# Patient Record
Sex: Male | Born: 1960 | Race: Black or African American | Hispanic: No | Marital: Married | Smoking: Current some day smoker
Health system: Southern US, Community
[De-identification: ages and names within clinical notes are randomized; demographics above are authoritative.]

## PROBLEM LIST (undated history)

## (undated) DIAGNOSIS — R0683 Snoring: Secondary | ICD-10-CM

## (undated) DIAGNOSIS — H9190 Unspecified hearing loss, unspecified ear: Secondary | ICD-10-CM

## (undated) DIAGNOSIS — K625 Hemorrhage of anus and rectum: Secondary | ICD-10-CM

## (undated) DIAGNOSIS — Z974 Presence of external hearing-aid: Secondary | ICD-10-CM

## (undated) DIAGNOSIS — M549 Dorsalgia, unspecified: Secondary | ICD-10-CM

## (undated) DIAGNOSIS — Z789 Other specified health status: Secondary | ICD-10-CM

## (undated) HISTORY — PX: COLONOSCOPY: SHX174

## (undated) HISTORY — DX: Unspecified hearing loss, unspecified ear: H91.90

## (undated) HISTORY — PX: WISDOM TOOTH EXTRACTION: SHX21

---

## 2013-01-05 ENCOUNTER — Ambulatory Visit (INDEPENDENT_AMBULATORY_CARE_PROVIDER_SITE_OTHER): Payer: 59 | Admitting: Family Medicine

## 2013-01-05 ENCOUNTER — Ambulatory Visit: Payer: 59

## 2013-01-05 VITALS — BP 136/84 | HR 74 | Temp 98.7°F | Resp 17 | Ht 69.0 in | Wt 211.0 lb

## 2013-01-05 DIAGNOSIS — M79646 Pain in unspecified finger(s): Secondary | ICD-10-CM

## 2013-01-05 DIAGNOSIS — S61012A Laceration without foreign body of left thumb without damage to nail, initial encounter: Secondary | ICD-10-CM

## 2013-01-05 DIAGNOSIS — S61209A Unspecified open wound of unspecified finger without damage to nail, initial encounter: Secondary | ICD-10-CM

## 2013-01-05 MED ORDER — TRAMADOL HCL 50 MG PO TABS: 50.0000 mg | ORAL_TABLET | Freq: Three times a day (TID) | ORAL | Status: DC | PRN

## 2013-01-05 NOTE — Progress Notes (Signed)
Patient ID: ASHOK SAWAYA MRN: [id number], DOB: Jun 24, 1961, 52 y.o. Date of Encounter: 01/05/2013, 12:04 PM   PROCEDURE NOTE: Verbal consent obtained. Sterile technique employed. Numbing: Anesthesia obtained with 2% lidocaine plain.   Cleansed with soap and water. Irrigated.  Wound explored, no deep structures involved, no foreign bodies.   Wound repaired with # 3 SI and #2 HM sutures Hemostasis obtained. Wound cleansed and dressed.  Wound care instructions including precautions covered with patient. Handout given.  Anticipate suture removal in 10 days  Rhoderick Moody, PA-C 01/05/2013 12:04 PM

## 2013-01-05 NOTE — Progress Notes (Signed)
  [facility]:  Office Visit  Chief Complaint:  Chief Complaint  Patient presents with  . Hand Injury    cut thumb     HPI: Roy Bryant is a right handed 52 y.o. male who complains of   Left thumb laceration last night at 12:30 am , opening/cutting boxes with his personal pocket knife and slipped. + pain and bleeding. Denies numbness, tingling. Used gauze and compression to stop bleeding. UTD on tetanus. He denies having diabetes. He works for the city of [employer] and uses his hand. He is here because the bleeding is still continuing even after compression. He does not take any blood thinners.   Past Medical History  Diagnosis Date  . Hearing loss    History reviewed. No pertinent past surgical history. History   Social History  . Marital Status: Single    Spouse Name: N/A    Number of Children: N/A  . Years of Education: N/A   Social History Main Topics  . Smoking status: Current Some Day Smoker    Types: Cigarettes  . Smokeless tobacco: None  . Alcohol Use: 0.6 oz/week    1 Cans of beer per week  . Drug Use: No  . Sexually Active: Yes    Birth Control/ Protection: Abstinence   Other Topics Concern  . None   Social History Narrative  . None   Family History  Problem Relation Age of Onset  . Diabetes Daughter    No Known Allergies Prior to Admission medications   Not on File     ROS: The patient denies fevers, chills, night sweats, unintentional weight loss, chest pain, palpitations, wheezing, dyspnea on exertion, nausea, vomiting, abdominal pain, dysuria, hematuria, melena, numbness, weakness, or tingling.   All other systems have been reviewed and were otherwise negative with the exception of those mentioned in the HPI and as above.    PHYSICAL EXAM: Filed Vitals:   01/05/13 1032  BP: 136/84  Pulse: 74  Temp: 98.7 F (37.1 C)  Resp: 17   Filed Vitals:   01/05/13 1032  Height: 5\' 9"  (1.753 m)  Weight: 211 lb (95.709 kg)    Body mass index is 31.16 kg/(m^2).  General: Alert, no acute distress HEENT:  Normocephalic, atraumatic, oropharynx patent.  Cardiovascular:  Regular rate and rhythm, no rubs murmurs or gallops.  No Carotid bruits, radial pulse intact. No pedal edema.  Respiratory: Clear to auscultation bilaterally.  No wheezes, rales, or rhonchi.  No cyanosis, no use of accessory musculature GI: No organomegaly, abdomen is soft and non-tender, positive bowel sounds.  No masses. Skin: No rashes. Neurologic: Facial musculature symmetric. Psychiatric: Patient is appropriate throughout our interaction. Lymphatic: No cervical lymphadenopathy Musculoskeletal: Gait intact. Left thumb-+ 3/4 inch horizontal lac proximal to IP jt No tendon involvement Full ROM, 5/5 strength , sensation and radial pulse intact + tenderness   LABS: No results found for this or any previous visit.   EKG/XRAY:   Primary read interpreted by Dr. Conley Rolls at [facility]. No fractures/dislocation/foreign bodies.   ASSESSMENT/PLAN: Encounter Diagnosis  Name Primary?  . Laceration of thumb, left Yes   Stitches Clean wound,  no need for abx Rx Tramadol for pain control  Monitor for worsening sxs F/u as directed, wound care as directed    ,  PHUONG, DO 01/05/2013 11:58 AM

## 2013-01-14 ENCOUNTER — Ambulatory Visit (INDEPENDENT_AMBULATORY_CARE_PROVIDER_SITE_OTHER): Payer: 59 | Admitting: Physician Assistant

## 2013-01-14 VITALS — BP 141/83 | HR 76 | Temp 98.0°F | Resp 16 | Ht 69.0 in | Wt 216.0 lb

## 2013-01-14 DIAGNOSIS — L02519 Cutaneous abscess of unspecified hand: Secondary | ICD-10-CM

## 2013-01-14 DIAGNOSIS — T148XXA Other injury of unspecified body region, initial encounter: Secondary | ICD-10-CM

## 2013-01-14 DIAGNOSIS — L089 Local infection of the skin and subcutaneous tissue, unspecified: Secondary | ICD-10-CM

## 2013-01-14 DIAGNOSIS — Z4802 Encounter for removal of sutures: Secondary | ICD-10-CM

## 2013-01-14 MED ORDER — SULFAMETHOXAZOLE-TRIMETHOPRIM 800-160 MG PO TABS: 1.0000 | ORAL_TABLET | Freq: Two times a day (BID) | ORAL | Status: DC

## 2013-01-14 NOTE — Progress Notes (Signed)
  Subjective:    Patient ID: Roy Bryant, male    DOB: [birth year], 52 y.o.   MRN: [id number]  HPI   Roy Bryant is 52 yr old male here for removal of sutures placed here 10 days ago.  States he is doing well, but still has some tenderness over the wound.  Still having some difficulty fully flexing the thumb, has concerns that there may have been tendon involvement.    Review of Systems  Constitutional: Negative.   HENT: Negative.   Respiratory: Negative.   Cardiovascular: Negative.   Gastrointestinal: Negative.   Musculoskeletal: Negative.   Skin: Positive for wound (thumb, left).  Neurological: Negative.        Objective:   Physical Exam  Vitals reviewed. Constitutional: He is oriented to person, place, and time. He appears well-developed and well-nourished. No distress.  HENT:  Head: Normocephalic and atraumatic.  Eyes: Conjunctivae normal are normal. No scleral icterus.  Pulmonary/Chest: Effort normal.  Musculoskeletal:       Left hand: He exhibits normal range of motion and normal capillary refill. normal sensation noted. Normal strength noted.       Hands: Neurological: He is alert and oriented to person, place, and time.  Skin: Skin is warm and dry.       Dorsal aspect of left thumb with healing laceration; wound is closed, edges well approximated, some scabbing; TTP over the proximal wound edge; on removal of suture some purulent drainage; no erythema, induration, or warmth  Psychiatric: He has a normal mood and affect. His behavior is normal.     Filed Vitals:   01/14/13 0841  BP: 141/83  Pulse: 76  Temp: 98 F (36.7 C)  Resp: 16        Assessment & Plan:   1. Wound infection  sulfamethoxazole-trimethoprim (BACTRIM DS,SEPTRA DS) 800-160 MG per tablet, Wound culture  2. Visit for suture removal      Roy Bryant is a very pleasant 52 yr old male here for suture removal.  Healing laceration of the dorsal aspect of the left thumb.  Sutures removed.  With  removal of two of the sutures, some purulence drained from the proximal wound edge.  Culture collected.  Will start TMP/SMX BID x 7 days. Will adjust if necessary based on culture data.  Discussed with pt that the flexor tendon of the thumb is located on the palmar side of the thumb, opposite of where his wound is located.  Suspect that with flexion he is feeling tension across the wound from where the sutures were placed as the laceration is directly over DIP joint.  Encouraged him to work on gentle ROM.  He will let us know if worsening or not improving.

## 2013-01-14 NOTE — Patient Instructions (Addendum)
Begin taking the antibiotic as directed.  Take with food to reduce stomach upset.  Let us know if there is continued pain, redness, swelling, drainage, or if you have fever.   Wound Infection A wound infection happens when a type of germ (bacteria) starts growing in the wound. In some cases, this can cause the wound to break open. If cared for properly, the infected wound will heal from the inside to the outside. Wound infections need treatment. CAUSES An infection is caused by bacteria growing in the wound.  SYMPTOMS   Increase in redness, swelling, or pain at the wound site.  Increase in drainage at the wound site.  Wound or bandage (dressing) starts to smell bad.  Fever.  Feeling tired or fatigued.  Pus draining from the wound. TREATMENT  You caregiver will prescribe antibiotic medicine. The wound infection should improve within 24 to 48 hours. Any redness around the wound should stop spreading and the wound should be less painful.  HOME CARE INSTRUCTIONS   Only take over-the-counter or prescription medicines for pain, discomfort, or fever as directed by your caregiver.  Take your antibiotics as directed. Finish them even if you start to feel better.  Gently wash the area with mild soap and water 2 times a day, or as directed. Rinse off the soap. Pat the area dry with a clean towel. Do not rub the wound. This may cause bleeding.  Follow your caregiver's instructions for how often you need to change the dressing.  Apply ointment and a dressing to the wound as directed.  If the dressing sticks, moisten it with soapy water and gently remove it.  Change the bandage right away if it becomes wet, dirty, or develops a bad smell.  Take showers. Do not take tub baths, swim, or do anything that may soak the wound until it is healed.  Avoid exercises that make you sweat heavily.  Use anti-itch medicine as directed by your caregiver. The wound may itch when it is healing. Do not pick  or scratch at the wound.  Follow up with your caregiver to get your wound rechecked as directed. SEEK MEDICAL CARE IF:  You have an increase in swelling, pain, or redness around the wound.  You have an increase in the amount of pus coming from the wound.  There is a bad smell coming from the wound.  More of the wound breaks open.  You have a fever. MAKE SURE YOU:   Understand these instructions.  Will watch your condition.  Will get help right away if you are not doing well or get worse. Document Released: 08/25/2003 Document Revised: 02/18/2012 Document Reviewed: 04/01/2011 [facility] Patient Information 2013 [employer], [place].

## 2013-01-16 LAB — WOUND CULTURE
Gram Stain: NONE SEEN
Gram Stain: NONE SEEN

## 2013-01-30 ENCOUNTER — Encounter (HOSPITAL_BASED_OUTPATIENT_CLINIC_OR_DEPARTMENT_OTHER): Payer: Self-pay | Admitting: *Deleted

## 2013-01-30 ENCOUNTER — Other Ambulatory Visit: Payer: Self-pay | Admitting: Orthopedic Surgery

## 2013-01-30 NOTE — Progress Notes (Signed)
Denies any ht or resp problems

## 2013-02-02 ENCOUNTER — Encounter (HOSPITAL_BASED_OUTPATIENT_CLINIC_OR_DEPARTMENT_OTHER): Payer: Self-pay | Admitting: Anesthesiology

## 2013-02-02 ENCOUNTER — Ambulatory Visit (HOSPITAL_BASED_OUTPATIENT_CLINIC_OR_DEPARTMENT_OTHER): Payer: 59 | Admitting: Anesthesiology

## 2013-02-02 ENCOUNTER — Ambulatory Visit (HOSPITAL_BASED_OUTPATIENT_CLINIC_OR_DEPARTMENT_OTHER)
Admission: RE | Admit: 2013-02-02 | Discharge: 2013-02-02 | Disposition: A | Discharge status: Home / Self Care | Payer: 59 | Source: Ambulatory Visit | Attending: Orthopedic Surgery | Admitting: Orthopedic Surgery

## 2013-02-02 ENCOUNTER — Encounter (HOSPITAL_BASED_OUTPATIENT_CLINIC_OR_DEPARTMENT_OTHER)
Admission: RE | Disposition: A | Discharge status: Home / Self Care | Payer: Self-pay | Source: Ambulatory Visit | Attending: Orthopedic Surgery

## 2013-02-02 ENCOUNTER — Encounter (HOSPITAL_BASED_OUTPATIENT_CLINIC_OR_DEPARTMENT_OTHER): Payer: Self-pay | Admitting: *Deleted

## 2013-02-02 DIAGNOSIS — F17200 Nicotine dependence, unspecified, uncomplicated: Secondary | ICD-10-CM | POA: Insufficient documentation

## 2013-02-02 DIAGNOSIS — H9190 Unspecified hearing loss, unspecified ear: Secondary | ICD-10-CM | POA: Insufficient documentation

## 2013-02-02 DIAGNOSIS — S61209A Unspecified open wound of unspecified finger without damage to nail, initial encounter: Secondary | ICD-10-CM | POA: Insufficient documentation

## 2013-02-02 DIAGNOSIS — W269XXA Contact with unspecified sharp object(s), initial encounter: Secondary | ICD-10-CM | POA: Insufficient documentation

## 2013-02-02 HISTORY — DX: Other specified health status: Z78.9

## 2013-02-02 HISTORY — DX: Snoring: R06.83

## 2013-02-02 HISTORY — PX: TENDON REPAIR: SHX5111

## 2013-02-02 HISTORY — DX: Presence of external hearing-aid: Z97.4

## 2013-02-02 LAB — POCT HEMOGLOBIN-HEMACUE: Hemoglobin: 14 g/dL (ref 13.0–17.0)

## 2013-02-02 SURGERY — TENDON REPAIR
Anesthesia: General | Site: Thumb | Laterality: Left | Wound class: Contaminated

## 2013-02-02 MED ORDER — FENTANYL CITRATE 0.05 MG/ML IJ SOLN: 50.0000 ug | INTRAMUSCULAR | Status: DC | PRN

## 2013-02-02 MED ORDER — MIDAZOLAM HCL 2 MG/2ML IJ SOLN: 1.0000 mg | INTRAMUSCULAR | Status: DC | PRN

## 2013-02-02 MED ORDER — HYDROCODONE-ACETAMINOPHEN 5-325 MG PO TABS: ORAL_TABLET | ORAL | Status: DC

## 2013-02-02 MED ORDER — LACTATED RINGERS IV SOLN
INTRAVENOUS | Status: DC
  Administered 2013-02-02: 10 mL/h via INTRAVENOUS
  Administered 2013-02-02 (×2): via INTRAVENOUS

## 2013-02-02 MED ORDER — DEXAMETHASONE SODIUM PHOSPHATE 4 MG/ML IJ SOLN
INTRAMUSCULAR | Status: DC | PRN
  Administered 2013-02-02: 10 mg via INTRAVENOUS

## 2013-02-02 MED ORDER — BUPIVACAINE HCL (PF) 0.25 % IJ SOLN
INTRAMUSCULAR | Status: DC | PRN
  Administered 2013-02-02: 8 mL

## 2013-02-02 MED ORDER — MIDAZOLAM HCL 5 MG/5ML IJ SOLN
INTRAMUSCULAR | Status: DC | PRN
  Administered 2013-02-02: 2 mg via INTRAVENOUS

## 2013-02-02 MED ORDER — 0.9 % SODIUM CHLORIDE (POUR BTL) OPTIME
TOPICAL | Status: DC | PRN
  Administered 2013-02-02: 200 mL

## 2013-02-02 MED ORDER — PROPOFOL 10 MG/ML IV BOLUS
INTRAVENOUS | Status: DC | PRN
  Administered 2013-02-02: 250 mg via INTRAVENOUS

## 2013-02-02 MED ORDER — CEFAZOLIN SODIUM-DEXTROSE 2-3 GM-% IV SOLR
2.0000 g | INTRAVENOUS | Status: AC
  Administered 2013-02-02: 2 g via INTRAVENOUS

## 2013-02-02 MED ORDER — OXYCODONE HCL 5 MG PO TABS
5.0000 mg | ORAL_TABLET | Freq: Once | ORAL | Status: AC | PRN
  Administered 2013-02-02: 5 mg via ORAL

## 2013-02-02 MED ORDER — LIDOCAINE HCL (CARDIAC) 20 MG/ML IV SOLN
INTRAVENOUS | Status: DC | PRN
  Administered 2013-02-02: 60 mg via INTRAVENOUS

## 2013-02-02 MED ORDER — HYDROMORPHONE HCL PF 1 MG/ML IJ SOLN
0.2500 mg | INTRAMUSCULAR | Status: DC | PRN
  Administered 2013-02-02 (×4): 0.5 mg via INTRAVENOUS

## 2013-02-02 MED ORDER — CHLORHEXIDINE GLUCONATE 4 % EX LIQD
60.0000 mL | Freq: Once | CUTANEOUS | Status: AC
  Administered 2013-02-02: 4 via TOPICAL

## 2013-02-02 MED ORDER — FENTANYL CITRATE 0.05 MG/ML IJ SOLN
INTRAMUSCULAR | Status: DC | PRN
  Administered 2013-02-02: 25 ug via INTRAVENOUS
  Administered 2013-02-02: 50 ug via INTRAVENOUS

## 2013-02-02 MED ORDER — METOCLOPRAMIDE HCL 5 MG/ML IJ SOLN: 10.0000 mg | Freq: Once | INTRAMUSCULAR | Status: DC | PRN

## 2013-02-02 MED ORDER — ONDANSETRON HCL 4 MG/2ML IJ SOLN
INTRAMUSCULAR | Status: DC | PRN
  Administered 2013-02-02: 4 mg via INTRAVENOUS

## 2013-02-02 MED ORDER — OXYCODONE HCL 5 MG/5ML PO SOLN: 5.0000 mg | Freq: Once | ORAL | Status: AC | PRN

## 2013-02-02 SURGICAL SUPPLY — 89 items
BAG DECANTER FOR FLEXI CONT (MISCELLANEOUS) IMPLANT
BALL CTTN LRG ABS STRL LF (GAUZE/BANDAGES/DRESSINGS)
BANDAGE CONFORM 2  STR LF (GAUZE/BANDAGES/DRESSINGS) IMPLANT
BANDAGE ELASTIC 3 VELCRO ST LF (GAUZE/BANDAGES/DRESSINGS) ×2 IMPLANT
BANDAGE GAUZE ELAST BULKY 4 IN (GAUZE/BANDAGES/DRESSINGS) IMPLANT
BLADE MINI RND TIP GREEN BEAV (BLADE) IMPLANT
BLADE SURG 15 STRL LF DISP TIS (BLADE) ×2 IMPLANT
BLADE SURG 15 STRL SS (BLADE) ×4
BNDG CMPR 9X4 STRL LF SNTH (GAUZE/BANDAGES/DRESSINGS) ×1
BNDG CMPR MD 5X2 ELC HKLP STRL (GAUZE/BANDAGES/DRESSINGS)
BNDG ELASTIC 2 VLCR STRL LF (GAUZE/BANDAGES/DRESSINGS) IMPLANT
BNDG ESMARK 4X9 LF (GAUZE/BANDAGES/DRESSINGS) ×2 IMPLANT
CHLORAPREP W/TINT 26ML (MISCELLANEOUS) ×2 IMPLANT
CLOTH BEACON ORANGE TIMEOUT ST (SAFETY) ×2 IMPLANT
CORDS BIPOLAR (ELECTRODE) ×2 IMPLANT
COTTONBALL LRG STERILE PKG (GAUZE/BANDAGES/DRESSINGS) IMPLANT
COVER MAYO STAND STRL (DRAPES) ×2 IMPLANT
COVER TABLE BACK 60X90 (DRAPES) ×2 IMPLANT
CUFF TOURNIQUET SINGLE 18IN (TOURNIQUET CUFF) ×2 IMPLANT
DECANTER SPIKE VIAL GLASS SM (MISCELLANEOUS) IMPLANT
DRAIN TLS ROUND 10FR (DRAIN) IMPLANT
DRAPE EXTREMITY T 121X128X90 (DRAPE) ×2 IMPLANT
DRAPE OEC MINIVIEW 54X84 (DRAPES) ×1 IMPLANT
DRAPE SURG 17X23 STRL (DRAPES) ×2 IMPLANT
DRSG PAD ABDOMINAL 8X10 ST (GAUZE/BANDAGES/DRESSINGS) IMPLANT
GAUZE SPONGE 4X4 16PLY XRAY LF (GAUZE/BANDAGES/DRESSINGS) IMPLANT
GAUZE XEROFORM 1X8 LF (GAUZE/BANDAGES/DRESSINGS) ×2 IMPLANT
GLOVE BIO SURGEON STRL SZ 6.5 (GLOVE) ×1 IMPLANT
GLOVE BIO SURGEON STRL SZ7.5 (GLOVE) ×2 IMPLANT
GLOVE BIOGEL PI IND STRL 8 (GLOVE) ×1 IMPLANT
GLOVE BIOGEL PI INDICATOR 8 (GLOVE) ×1
GLOVE INDICATOR 8.5 STRL (GLOVE) ×1 IMPLANT
GLOVE SURG ORTHO 8.0 STRL STRW (GLOVE) ×1 IMPLANT
GOWN PREVENTION PLUS XLARGE (GOWN DISPOSABLE) ×2 IMPLANT
GOWN PREVENTION PLUS XXLARGE (GOWN DISPOSABLE) ×3 IMPLANT
GOWN STRL REIN XL XLG (GOWN DISPOSABLE) ×2 IMPLANT
K-WIRE .045X4 (WIRE) ×1 IMPLANT
KWIRE 4.0 X .035IN (WIRE) IMPLANT
LOOP VESSEL MAXI BLUE (MISCELLANEOUS) IMPLANT
NDL HYPO 25X1 1.5 SAFETY (NEEDLE) IMPLANT
NDL KEITH (NEEDLE) IMPLANT
NEEDLE HYPO 22GX1.5 SAFETY (NEEDLE) IMPLANT
NEEDLE HYPO 25X1 1.5 SAFETY (NEEDLE) ×2 IMPLANT
NEEDLE KEITH (NEEDLE) IMPLANT
NS IRRIG 1000ML POUR BTL (IV SOLUTION) ×2 IMPLANT
PACK BASIN DAY SURGERY FS (CUSTOM PROCEDURE TRAY) ×2 IMPLANT
PAD CAST 3X4 CTTN HI CHSV (CAST SUPPLIES) ×1 IMPLANT
PAD CAST 4YDX4 CTTN HI CHSV (CAST SUPPLIES) IMPLANT
PADDING CAST ABS 3INX4YD NS (CAST SUPPLIES)
PADDING CAST ABS 4INX4YD NS (CAST SUPPLIES)
PADDING CAST ABS COTTON 3X4 (CAST SUPPLIES) IMPLANT
PADDING CAST ABS COTTON 4X4 ST (CAST SUPPLIES) ×1 IMPLANT
PADDING CAST COTTON 3X4 STRL (CAST SUPPLIES) ×2
PADDING CAST COTTON 4X4 STRL (CAST SUPPLIES)
SLEEVE SCD COMPRESS KNEE MED (MISCELLANEOUS) ×1 IMPLANT
SPLINT PLASTER CAST XFAST 3X15 (CAST SUPPLIES) IMPLANT
SPLINT PLASTER CAST XFAST 4X15 (CAST SUPPLIES) IMPLANT
SPLINT PLASTER XTRA FAST SET 4 (CAST SUPPLIES)
SPLINT PLASTER XTRA FASTSET 3X (CAST SUPPLIES)
SPONGE GAUZE 4X4 12PLY (GAUZE/BANDAGES/DRESSINGS) ×2 IMPLANT
STOCKINETTE 4X48 STRL (DRAPES) ×2 IMPLANT
SUT CHROMIC 5 0 P 3 (SUTURE) IMPLANT
SUT ETHIBOND 3-0 V-5 (SUTURE) IMPLANT
SUT ETHILON 3 0 PS 1 (SUTURE) IMPLANT
SUT ETHILON 4 0 PS 2 18 (SUTURE) IMPLANT
SUT FIBERWIRE 3-0 18 TAPR NDL (SUTURE)
SUT FIBERWIRE 4-0 18 DIAM BLUE (SUTURE)
SUT MERSILENE 2.0 SH NDLE (SUTURE) IMPLANT
SUT MERSILENE 3 0 FS 1 (SUTURE) IMPLANT
SUT MERSILENE 4 0 P 3 (SUTURE) IMPLANT
SUT POLY BUTTON 15MM (SUTURE) IMPLANT
SUT PROLENE 2 0 SH DA (SUTURE) IMPLANT
SUT PROLENE 6 0 P 1 18 (SUTURE) IMPLANT
SUT SILK 2 0 FS (SUTURE) IMPLANT
SUT SILK 4 0 PS 2 (SUTURE) IMPLANT
SUT STEEL 4 0 V 26 (SUTURE) IMPLANT
SUT VIC AB 3-0 PS1 18 (SUTURE)
SUT VIC AB 3-0 PS1 18XBRD (SUTURE) IMPLANT
SUT VIC AB 4-0 P-3 18XBRD (SUTURE) IMPLANT
SUT VIC AB 4-0 P3 18 (SUTURE)
SUT VICRYL 4-0 PS2 18IN ABS (SUTURE) IMPLANT
SUTURE FIBERWR 3-0 18 TAPR NDL (SUTURE) IMPLANT
SUTURE FIBERWR 4-0 18 DIA BLUE (SUTURE) IMPLANT
SYR BULB 3OZ (MISCELLANEOUS) ×2 IMPLANT
SYR CONTROL 10ML LL (SYRINGE) ×1 IMPLANT
TOWEL OR 17X24 6PK STRL BLUE (TOWEL DISPOSABLE) ×4 IMPLANT
TUBE FEEDING 5FR 15 INCH (TUBING) IMPLANT
UNDERPAD 30X30 INCONTINENT (UNDERPADS AND DIAPERS) ×2 IMPLANT
WATER STERILE IRR 1000ML POUR (IV SOLUTION) ×1 IMPLANT

## 2013-02-02 NOTE — Anesthesia Preprocedure Evaluation (Signed)
Anesthesia Evaluation  Patient identified by MRN, date of birth, ID band Patient awake    Reviewed: Allergy & Precautions, H&P , NPO status , Patient's Chart, lab work & pertinent test results, reviewed documented beta blocker date and time   Airway Mallampati: II TM Distance: >3 FB Neck ROM: full    Dental   Pulmonary neg pulmonary ROS,  breath sounds clear to auscultation        Cardiovascular negative cardio ROS  Rhythm:regular     Neuro/Psych negative neurological ROS  negative psych ROS   GI/Hepatic negative GI ROS, Neg liver ROS,   Endo/Other  negative endocrine ROS  Renal/GU negative Renal ROS  negative genitourinary   Musculoskeletal   Abdominal   Peds  Hematology negative hematology ROS (+)   Anesthesia Other Findings See surgeon's H&P   Reproductive/Obstetrics negative OB ROS                           Anesthesia Physical Anesthesia Plan  ASA: II  Anesthesia Plan: General   Post-op Pain Management:    Induction: Intravenous  Airway Management Planned: LMA  Additional Equipment:   Intra-op Plan:   Post-operative Plan: Extubation in OR  Informed Consent: I have reviewed the patients History and Physical, chart, labs and discussed the procedure including the risks, benefits and alternatives for the proposed anesthesia with the patient or authorized representative who has indicated his/her understanding and acceptance.   Dental Advisory Given  Plan Discussed with: CRNA and Surgeon  Anesthesia Plan Comments:         Anesthesia Quick Evaluation  

## 2013-02-02 NOTE — Anesthesia Procedure Notes (Signed)
Procedure Name: LMA Insertion Date/Time: 02/02/2013 3:47 PM Performed by: Shazia Mitchener D Pre-anesthesia Checklist: Patient identified, Emergency Drugs available, Suction available and Patient being monitored Patient Re-evaluated:Patient Re-evaluated prior to inductionOxygen Delivery Method: Circle System Utilized Preoxygenation: Pre-oxygenation with 100% oxygen Intubation Type: IV induction Ventilation: Mask ventilation without difficulty LMA: LMA inserted LMA Size: 5.0 Number of attempts: 1 Placement Confirmation: positive ETCO2 Tube secured with: Tape Dental Injury: Teeth and Oropharynx as per pre-operative assessment

## 2013-02-02 NOTE — Op Note (Signed)
[id number] 

## 2013-02-02 NOTE — H&P (Signed)
  Roy Bryant is an 52 y.o. male.   Chief Complaint: left thumb laceration HPI: 52 yo rhd male states he lacerated left thumb 01/16/13.  Seen at [facility] and wound sutured.  States he has been unable to extend at ip joint since injury.  Reports no previous injury to thumb and no other injuries at this time.  Past Medical History  Diagnosis Date  . Hearing loss   . Medical history non-contributory   . Snores   . Wears hearing aid     right ear    Past Surgical History  Procedure Laterality Date  . Wisdom tooth extraction    . Colonoscopy      Family History  Problem Relation Age of Onset  . Diabetes Daughter    Social History:  reports that he has been smoking Cigarettes.  He has been smoking about 0.00 packs per day. He does not have any smokeless tobacco history on file. He reports that he drinks about 0.6 ounces of alcohol per week. He reports that he does not use illicit drugs.  Allergies: No Known Allergies  No prescriptions prior to admission    No results found for this or any previous visit (from the past 48 hour(s)).  No results found.   A comprehensive review of systems was negative except for: Ears, nose, mouth, throat, and face: positive for hearing loss  Height 5\' 9"  (1.753 m), weight 97.523 kg (215 lb).  General appearance: alert, cooperative and appears stated age Head: Normocephalic, without obvious abnormality, atraumatic Neck: supple, symmetrical, trachea midline Resp: clear to auscultation bilaterally Cardio: regular rate and rhythm GI: non tender Extremities: intact sensatin and capillary refill all digits.  +fpl/io.  left thumb unable to extend at ip joint.  healed wound on dorsum at ip level.  no erythema or drainage. Pulses: 2+ and symmetric Skin: as above Neurologic: Grossly normal Incision/Wound: As above  Assessment/Plan Left thumb epl laceration.  Recommend operative exploration with repair of epl and pinning of ip joint.  Risks, benefits,  and alternatives of surgery were discussed and the patient agrees with the plan of care.   Kuper Rennels R 02/02/2013, 11:21 AM

## 2013-02-02 NOTE — Anesthesia Postprocedure Evaluation (Signed)
Anesthesia Post Note  Patient: Roy Bryant  Procedure(s) Performed: Procedure(s) (LRB): REPAIR LEFT THUMB EPL (EXTENSOR POLLICIS LONGUS) (Left)  Anesthesia type: General  Patient location: PACU  Post pain: Pain level controlled and Adequate analgesia  Post assessment: Post-op Vital signs reviewed, Patient's Cardiovascular Status Stable, Respiratory Function Stable, Patent Airway and Pain level controlled  Last Vitals:  Filed Vitals:   02/02/13 1730  BP: 135/91  Pulse: 81  Temp:   Resp: 18    Post vital signs: Reviewed and stable  Level of consciousness: awake, alert  and oriented  Complications: No apparent anesthesia complications

## 2013-02-02 NOTE — Brief Op Note (Signed)
02/02/2013  4:46 PM  PATIENT:  Roy Bryant  53 y.o. male  PRE-OPERATIVE DIAGNOSIS:  LEFT EPL(EXTENSOR POLLICIS LONGUS) LACERATION  POST-OPERATIVE DIAGNOSIS:  LEFT EPL(EXTENSOR POLLICIS LONGUS) LACERATION  PROCEDURE:  Procedure(s): REPAIR LEFT THUMB EPL (EXTENSOR POLLICIS LONGUS) (Left)  SURGEON:  Surgeon(s) and Role:    * Tami Ribas, MD - Primary    * Nicki Reaper, MD - Assisting  PHYSICIAN ASSISTANT:   ASSISTANTS: Cindee Salt, MD   ANESTHESIA:   general  EBL:  Total I/O In: 1500 [I.V.:1500] Out: -   BLOOD ADMINISTERED:none  DRAINS: none   LOCAL MEDICATIONS USED:  MARCAINE     SPECIMEN:  No Specimen  DISPOSITION OF SPECIMEN:  N/A  COUNTS:  YES  TOURNIQUET:  * Missing tourniquet times found for documented tourniquets in log:  [id number] *  DICTATION: .Other Dictation: Dictation Number [id number]  PLAN OF CARE: Discharge to home after PACU  PATIENT DISPOSITION:  PACU - hemodynamically stable.

## 2013-02-02 NOTE — Transfer of Care (Signed)
Immediate Anesthesia Transfer of Care Note  Patient: Roy Bryant  Procedure(s) Performed: Procedure(s): REPAIR LEFT THUMB EPL (EXTENSOR POLLICIS LONGUS) (Left)  Patient Location: PACU  Anesthesia Type:General  Level of Consciousness: awake and patient cooperative  Airway & Oxygen Therapy: Patient Spontanous Breathing and Patient connected to face mask oxygen  Post-op Assessment: Report given to PACU RN and Post -op Vital signs reviewed and stable  Post vital signs: Reviewed and stable  Complications: No apparent anesthesia complications

## 2013-02-03 ENCOUNTER — Encounter (HOSPITAL_BASED_OUTPATIENT_CLINIC_OR_DEPARTMENT_OTHER): Payer: Self-pay | Admitting: Orthopedic Surgery

## 2013-02-03 NOTE — Op Note (Signed)
NAMEElita Quick NO.:  [id number]  MEDICAL RECORD NO.:  [id number]  LOCATION:                                 FACILITY:  PHYSICIAN:  Betha Loa, MD             DATE OF BIRTH:  DATE OF PROCEDURE:  02/02/2013 DATE OF DISCHARGE:                              OPERATIVE REPORT   PREOPERATIVE DIAGNOSIS:  Left thumb extensor pollicis longus laceration.  POSTOPERATIVE DIAGNOSIS:  Left thumb extensor pollicis longus laceration.  PROCEDURE:  Left thumb exploration and repair of extensor pollicis longus, with pinning of the IP joint.  SURGEON:  Betha Loa, MD  ASSISTANT:  Cindee Salt, MD.  ANESTHESIA:  General.  IV FLUIDS:  Per anesthesia flow sheet.  ESTIMATED BLOOD LOSS:  Minimal.  COMPLICATIONS:  None.  SPECIMENS:  None.  TOURNIQUET TIME:  22 minutes.  DISPOSITION:  Stable to PACU.  INDICATIONS:  Mr. Havey is a 52 year old male who approximately 2-1/2 weeks ago lacerated the left thumb.  He was seen at urgent care facility where the wound was sutured.  He followed up with me in the office last week.  He had inability to extend at the IP joint.  He states that this has been present since his injury.  I discussed with Mr. Drost, the nature of the injury, recommended operative exploration of the wound with likely repair of the EPL tendon.  Risks, benefits, and alternatives of the surgery were discussed including risk of blood loss, infection, damage to nerves, vessels, tendons, ligaments, bone; failure of surgery; need for additional surgery, complications with wound healing, and stiffness.  He voiced understanding of these risks and elected to proceed.  OPERATIVE COURSE:  After being identified preoperatively by myself, the patient and I agreed upon procedure and site of the procedure.  Surgical site was marked.  The risks, benefits, and alternatives of the surgery were reviewed and wished to proceed.  Surgical consent had been signed. He was  given 2 g of IV Ancef as preoperative antibiotic prophylaxis.  He was transferred to the operating room, placed in the operating room table in supine position with left upper extremity on arm board. General anesthesia was induced by anesthesiologist.  Left upper extremity was prepped and draped in normal sterile orthopedic fashion. A surgical pause was performed between surgeons, anesthesia, and operating staff, and all were in agreement as to the patient, procedure, and site of the procedure.  Tourniquet at the proximal aspect of the extremity was inflated to 250 mmHg after exsanguination of the limb with an Esmarch bandage.  An incision was made including the traumatic wound. This was extended both proximally and distally.  The subcutaneous tissues were entered by spreading technique.  The laceration of the EPL tendon was noted.  There was distal tendon stump to suture.  The tendon was cleared of scar adhesion.  There was scar interposed between the tendon ends and this was excised.  The tendons were able to be opposed with the IP joint in full extension.  A 0.045 inch K-wire was advanced from the tip  of the thumb across the DIP joint.  Radiographs were taken in AP and lateral projections to ensure appropriate position of the IP joint and the pin, which was the case.  The pin was bent and cut short.  The tendon ends were reapproximated with 4-0 Mersilene suture in a figure-of-eight fashion.  This apposed the tendon edges well.  The wound was copiously irrigated with sterile saline.  The skin was closed with 5-0 nylon horizontal mattress fashion.  It was then dressed with sterile Xeroform, 4 x 4s.  A digital block was performed with 8 mL of 0.25% plain Marcaine to aid in postoperative analgesia.  A Kerlix was used to wrap the hand and thumb.  A thumb spica splint was placed and wrapped with Kerlix and Ace bandage.  Tourniquet was deflated at approximately 22 minutes.  The fingertips were  pink with brisk capillary refill after deflation of tourniquet.  Operative drapes were broken down.  The patient was awoken from anesthesia safely.  He was transferred back to stretcher and taken to PACU in stable condition.  I will see him back in the office in 1 week for postoperative followup.  I will give him Norco 5/325, 1-2 p.o. q.6 hours p.r.n. pain, dispensed #40.     Betha Loa, MD     KK/MEDQ  D:  02/02/2013  T:  02/03/2013  Job:  [id number]

## 2013-12-26 ENCOUNTER — Emergency Department (HOSPITAL_COMMUNITY)
Admission: EM | Admit: 2013-12-26 | Discharge: 2013-12-26 | Disposition: A | Discharge status: Home / Self Care | Payer: 59 | Attending: Emergency Medicine | Admitting: Emergency Medicine

## 2013-12-26 ENCOUNTER — Encounter (HOSPITAL_COMMUNITY): Payer: Self-pay | Admitting: Emergency Medicine

## 2013-12-26 ENCOUNTER — Emergency Department (HOSPITAL_COMMUNITY): Payer: 59

## 2013-12-26 DIAGNOSIS — Z9889 Other specified postprocedural states: Secondary | ICD-10-CM | POA: Insufficient documentation

## 2013-12-26 DIAGNOSIS — R0789 Other chest pain: Secondary | ICD-10-CM | POA: Insufficient documentation

## 2013-12-26 DIAGNOSIS — Z791 Long term (current) use of non-steroidal anti-inflammatories (NSAID): Secondary | ICD-10-CM | POA: Insufficient documentation

## 2013-12-26 DIAGNOSIS — Z8669 Personal history of other diseases of the nervous system and sense organs: Secondary | ICD-10-CM | POA: Insufficient documentation

## 2013-12-26 DIAGNOSIS — M25519 Pain in unspecified shoulder: Secondary | ICD-10-CM | POA: Insufficient documentation

## 2013-12-26 DIAGNOSIS — F17200 Nicotine dependence, unspecified, uncomplicated: Secondary | ICD-10-CM | POA: Insufficient documentation

## 2013-12-26 LAB — POCT I-STAT TROPONIN I
TROPONIN I, POC: 0 ng/mL (ref 0.00–0.08)
Troponin i, poc: 0 ng/mL (ref 0.00–0.08)

## 2013-12-26 LAB — COMPREHENSIVE METABOLIC PANEL
ALBUMIN: 4.3 g/dL (ref 3.5–5.2)
ALK PHOS: 76 U/L (ref 39–117)
ALT: 45 U/L (ref 0–53)
AST: 44 U/L — ABNORMAL HIGH (ref 0–37)
BUN: 16 mg/dL (ref 6–23)
CHLORIDE: 100 meq/L (ref 96–112)
CO2: 25 mEq/L (ref 19–32)
Calcium: 9 mg/dL (ref 8.4–10.5)
Creatinine, Ser: 1.28 mg/dL (ref 0.50–1.35)
GFR calc Af Amer: 73 mL/min — ABNORMAL LOW (ref >90)
GFR calc non Af Amer: 63 mL/min — ABNORMAL LOW (ref >90)
Glucose, Bld: 96 mg/dL (ref 70–99)
POTASSIUM: 4.9 meq/L (ref 3.7–5.3)
SODIUM: 136 meq/L — AB (ref 137–147)
TOTAL PROTEIN: 7.9 g/dL (ref 6.0–8.3)

## 2013-12-26 LAB — CBC
HCT: 45 % (ref 39.0–52.0)
HEMOGLOBIN: 15.5 g/dL (ref 13.0–17.0)
MCH: 30.2 pg (ref 26.0–34.0)
MCHC: 34.4 g/dL (ref 30.0–36.0)
MCV: 87.5 fL (ref 78.0–100.0)
Platelets: 299 10*3/uL (ref 150–400)
RBC: 5.14 MIL/uL (ref 4.22–5.81)
RDW: 14.5 % (ref 11.5–15.5)
WBC: 7.5 10*3/uL (ref 4.0–10.5)

## 2013-12-26 LAB — D-DIMER, QUANTITATIVE: D-Dimer, Quant: <0.27 ug/mL-FEU (ref 0.00–0.48)

## 2013-12-26 MED ORDER — MORPHINE SULFATE 4 MG/ML IJ SOLN
4.0000 mg | Freq: Once | INTRAMUSCULAR | Status: AC
  Administered 2013-12-26: 4 mg via INTRAVENOUS
  Filled 2013-12-26: qty 1

## 2013-12-26 MED ORDER — OXYCODONE-ACETAMINOPHEN 5-325 MG PO TABS
2.0000 | ORAL_TABLET | Freq: Once | ORAL | Status: AC
  Administered 2013-12-26: 2 via ORAL
  Filled 2013-12-26: qty 2

## 2013-12-26 MED ORDER — DIAZEPAM 5 MG/ML IJ SOLN
5.0000 mg | Freq: Once | INTRAMUSCULAR | Status: AC
  Administered 2013-12-26: 5 mg via INTRAVENOUS
  Filled 2013-12-26: qty 2

## 2013-12-26 MED ORDER — OXYCODONE-ACETAMINOPHEN 5-325 MG PO TABS: 1.0000 | ORAL_TABLET | ORAL | Status: DC | PRN

## 2013-12-26 MED ORDER — DIAZEPAM 5 MG PO TABS: 5.0000 mg | ORAL_TABLET | Freq: Four times a day (QID) | ORAL | Status: DC | PRN

## 2013-12-26 MED ORDER — ASPIRIN 81 MG PO CHEW
324.0000 mg | CHEWABLE_TABLET | Freq: Once | ORAL | Status: AC
  Administered 2013-12-26: 324 mg via ORAL
  Filled 2013-12-26: qty 4

## 2013-12-26 NOTE — ED Notes (Signed)
Pt c/o sever pain starting under his L shoulder blade and radiating to the middl eof his chest. Pain started last night. Pt also c/o accompanying SOB. Denies DIzzines, N/V. Pt had gastritis in the late 45s and sts that this pain is similar. Pt c/o increasing pain upon movement. Pt also c/o increasing pain when palpating under the L shoulder blade. Pt sts the pain feels like a spasm. A&Ox4.

## 2013-12-26 NOTE — ED Notes (Signed)
He states that, intermittently since this Tues., he has experienced pain at left shoulder blade radiating through to his mid-chest.  He also c/o some shortness of breath today.  He is in no distress.
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
## 2013-12-26 NOTE — ED Notes (Signed)
Pt also c/o of numbness in L leg and pinky finger x 1 year.

## 2013-12-26 NOTE — Discharge Instructions (Signed)
1. Medications: percocet for pain, valium for muscle spasm, usual home medications 2. Treatment: rest, drink plenty of fluids, use heat, gentle stretching 3. Follow Up: Please followup with your primary doctor for discussion of your diagnoses and further evaluation after today's visit; if you do not have a primary care doctor use the resource guide provided to find one;    Musculoskeletal Pain Musculoskeletal pain is muscle and boney aches and pains. These pains can occur in any part of the body. Your caregiver may treat you without knowing the cause of the pain. They may treat you if blood or urine tests, X-rays, and other tests were normal.  CAUSES There is often not a definite cause or reason for these pains. These pains may be caused by a type of germ (virus). The discomfort may also come from overuse. Overuse includes working out too hard when your body is not fit. Boney aches also come from weather changes. Bone is sensitive to atmospheric pressure changes. HOME CARE INSTRUCTIONS   Ask when your test results will be ready. Make sure you get your test results.  Only take over-the-counter or prescription medicines for pain, discomfort, or fever as directed by your caregiver. If you were given medications for your condition, do not drive, operate machinery or power tools, or sign legal documents for 24 hours. Do not drink alcohol. Do not take sleeping pills or other medications that may interfere with treatment.  Continue all activities unless the activities cause more pain. When the pain lessens, slowly resume normal activities. Gradually increase the intensity and duration of the activities or exercise.  During periods of severe pain, bed rest may be helpful. Lay or sit in any position that is comfortable.  Putting ice on the injured area.  Put ice in a bag.  Place a towel between your skin and the bag.  Leave the ice on for 15 to 20 minutes, 3 to 4 times a day.  Follow up with your  caregiver for continued problems and no reason can be found for the pain. If the pain becomes worse or does not go away, it may be necessary to repeat tests or do additional testing. Your caregiver may need to look further for a possible cause. SEEK IMMEDIATE MEDICAL CARE IF:  You have pain that is getting worse and is not relieved by medications.  You develop chest pain that is associated with shortness or breath, sweating, feeling sick to your stomach (nauseous), or throw up (vomit).  Your pain becomes localized to the abdomen.  You develop any new symptoms that seem different or that concern you. MAKE SURE YOU:   Understand these instructions.  Will watch your condition.  Will get help right away if you are not doing well or get worse. Document Released: 11/26/2005 Document Revised: 02/18/2012 Document Reviewed: 07/31/2013 [facility] Patient Information 2014 [place].    Emergency Department Resource Guide 1) Find a Doctor and Pay Out of Pocket Although you won't have to find out who is covered by your insurance plan, it is a good idea to ask around and get recommendations. You will then need to call the office and see if the doctor you have chosen will accept you as a new patient and what types of options they offer for patients who are self-pay. Some doctors offer discounts or will set up payment plans for their patients who do not have insurance, but you will need to ask so you aren't surprised when you get to your  appointment.  2) Contact Your Local Health Department Not all health departments have doctors that can see patients for sick visits, but many do, so it is worth a call to see if yours does. If you don't know where your local health department is, you can check in your phone book. The CDC also has a tool to help you locate your state's health department, and many state websites also have listings of all of their local health departments.  3) Find a [facility] If  your illness is not likely to be very severe or complicated, you may want to try a walk in clinic. These are popping up all over the country in pharmacies, drugstores, and shopping centers. They're usually staffed by nurse practitioners or physician assistants that have been trained to treat common illnesses and complaints. They're usually fairly quick and inexpensive. However, if you have serious medical issues or chronic medical problems, these are probably not your best option.  No Primary Care Doctor: - Call Health Connect at  [phone] - they can help you locate a primary care doctor that  accepts your insurance, provides certain services, etc. - Physician Referral Service- [phone]  Chronic Pain Problems: Organization         Address  Phone   Notes  [facility]  [phone] Patients need to be referred by their primary care doctor.   Medication Assistance: Organization         Address  Phone   Notes  [facility] Medication [facility] [address] [place], [place] [address] [phone] --Must be a resident of [facility] -- Must have NO insurance coverage whatsoever (no Medicaid/ Medicare, etc.) -- The pt. MUST have a primary care doctor that directs their care regularly and follows them in the community   MedAssist  [phone]   [employer]  [phone]    Agencies that provide inexpensive medical care: Organization         Address  Phone   Notes  [place]  [phone]   [facility]    [phone]   [facility] [place], [place] [address] [phone]   [place] [address] [place] [phone]   Planned Parenthood    [phone]   [facility]    [phone]   [place] and [place] [address], [place] Phone:  [phone], Fax:  [phone] Hours of  Operation:  9 am - 6 pm, M-F.  Also accepts Medicaid/Medicare and self-pay.  [facility] for [address], [address], [place] Phone: [phone], Fax: [phone]. Hours of Operation:  8:30 am - 5:30 pm, M-F.  Also accepts Medicaid and self-pay.  [place] [address] [place] Phone: [phone]   [place], [place], [place] [phone], Ext. [phone] Mondays & Thursdays: 7-9 AM.  First 15 patients are seen on a first come, first serve basis.    [place] Providers:  Organization         Address  Phone   Notes  [facility] [address], [place] [phone] Also accepts self-pay patients.  [place], [place]  [phone]   [facility] [address], [place] [phone]   [place] [address], [place] [phone]   Lucianne Lei [address], [place]   [phone] Only accepts [place] Access [place] patients after they have their name applied to their card.   Self-Pay (no insurance) in [facility]:  Organization         Address  Phone   Notes  Sickle Cell Patients, [facility] [place] [phone]   [facility] [place] [phone]   [facility] [place]  [place], [place], [place] [phone]   [facility]/Dr. Osei-Bonsu  [address] [place] or [address], [place] [phone] Phone number for both [place] and [place] locations is the same.  [facility] [address] [place] [phone]   [facility] [address] [place] or [address] [phone]   [facility] [address] [place] [phone], phone; [phone], fax Sees patients 1st and 3rd Saturday of every month.  Must not qualify for public or private insurance (i.e. Medicaid, Medicare, [place] Health Choice, Veterans' Benefits)  Household income should be no more than 200% of the poverty level The clinic cannot treat you if you are pregnant or think you are pregnant  Sexually transmitted diseases are not treated at the clinic.    Dental Care: Organization         Address  Phone  Notes  [facility] Department of [facility] [phone] Accepts children up to age 48 who are enrolled in [place] or [place]; pregnant women with a Medicaid card; and children who have applied for Medicaid or [place] Health Choice, but were declined, whose parents can pay a reduced fee at time of service.  [facility] of [facility]  [address], [place] [phone] Accepts children up to age 77 who are enrolled in [place] or [place]; pregnant women with a Medicaid card; and children who have applied for Medicaid or [place] Health Choice, but were declined, whose parents can pay a reduced fee at time of service.  [facility] Adult [facility] Access PROGRAM  [place] [phone] Patients are seen by appointment only. Walk-ins are not accepted. [place] will see patients 82 years of age and older. Monday - Tuesday (8am-5pm) Most Wednesdays (8:30-5pm) $30 per visit, cash only  [facility] Adult Dental Access PROGRAM  [address], [facility] [phone] Patients are seen by appointment only. Walk-ins are not accepted. [place] will see patients 56 years of age and older. One Wednesday Evening (Monthly: Volunteer Based).  $30 per visit, cash only  [place]  [phone] for adults; Children under age 41, call Graduate Pediatric  Dentistry at [phone]. Children aged 21-14, please call [phone] to request a pediatric application.  Dental services are provided in all areas of dental care including fillings, crowns and bridges, complete and partial dentures, implants, gum treatment, root canals, and extractions. Preventive care is also provided. Treatment is provided to both adults and children. Patients are selected via a lottery and there is often a waiting list.   [facility] [address], [place]  [phone] [email or web address]   [facility] [address] [place], [place] [phone], Ext. [phone] Second and Fourth Thursday of each month, opens at 6:30 AM; Clinic ends at 9 AM.  Patients are seen on a first-come first-served basis, and a limited number are seen during each clinic.   [facility]  [address] Hillard Danker [place], [place] [phone]   Eligibility Requirements You must have lived in [place], [place], or [place] counties for at least the last three months.   You cannot be eligible for state or federal sponsored [employer], including [employer], [place], or [employer].   You generally cannot be eligible for healthcare insurance through your employer.    How to apply: Eligibility screenings are held every Tuesday and Wednesday afternoon from 1:00 pm until 4:00 pm. You do not need an appointment for the interview!  [facility] [address] [place], [place]   [place]  [place] Department  [place]  [phone]    Behavioral Health Resources in the Community: Intensive Outpatient Programs Organization         Address  Phone  Notes  [place]. [address] [place], [place] [phone]   [facility] Outpatient [address] [place], [place]   ADS:  Alcohol & Drug Svcs [address], [place], [place]   [place] [address]  [place], [place] or [phone]   Substance Abuse Resources Organization         Address  Phone  Notes  Alcohol and Drug Services  [phone]   [place]  [phone]   [facility] [place]   Chinita Pester  [phone]   Residential & Outpatient Substance Abuse Program  [phone]   Psychological Services Organization         Address  Phone  Notes  [facility] [place]  [place]  [phone]   [place] [address] [place] or [phone]    Mobile Crisis Teams Organization         Address  Phone  Notes  [facility], Mobile Crisis Care Unit  [phone]   Assertive Psychotherapeutic Services  [address] [place], [place]   [place] [address], [place] [phone]    Self-Help/Support Groups Organization         Address  Phone             Notes  [place]. of [place] - variety of support groups  [place] Call for more information  Narcotics Anonymous (NA), [facility] [address], [employer] [place]  2 meetings at this location   Special educational needs teacher         Address  Phone  Notes  [facility] Residential Treatment [place],    [place]  [phone]   [facility]  [address] [place] [phone], [place], [place]   [place], [place] [phone] Admissions: 8am-3pm M-F  Incentives Substance [place] [address],    [place], [place] [phone]   [facility] [address] [place], [place], [place]   [facility] [address],  [place], [place]  Insight Programs - Intensive Outpatient [address],  [place], [place]   [facility] ([place].) [place].,  [place], [place] [phone] or [phone]   [facility] Services (RTS) [address] [place], [place] Accepts Medicaid  [facility] [place] [address],  [place] [phone] Substance Abuse/Addiction Treatment   [facility] Organization         Address  Phone  Notes  [facility]  [phone]   Domenic Schwab, PhD [address] Arlis [place], [place]   [phone] or [phone]   [place], [place] [phone]   [place] [address], [place], [place] [phone] Insurance/Medicaid/sponsorship through [facility] and Families [address] [place]                                    [place], [place] [phone] [place] [address], [place] [phone]    Dr. Adele Schilder  [phone]   [facility] Dept. 1) [address] [place] 2) [place] 3)  [place] [address], [place] [phone]  [phone]   [place] [phone] or [phone] (After Hours)

## 2013-12-27 NOTE — ED Provider Notes (Signed)
Medical screening examination/treatment/procedure(s) were performed by non-physician practitioner and as supervising physician I was immediately available for consultation/collaboration.  EKG Interpretation    Date/Time:  Saturday December 26 2013 18:12:01 EST Ventricular Rate:  85 PR Interval:  148 QRS Duration: 86 QT Interval:  337 QTC Calculation: 401 R Axis:   -34 Text Interpretation:  Sinus rhythm Left axis deviation RSR' in V1 or V2, right VCD or RVH No old tracing to compare Confirmed by Carles Florea  MD, [place] ([id number]) on 12/26/2013 6:44:11 PM              Ephraim Hamburger, MD 12/27/13 [phone]

## 2014-07-23 ENCOUNTER — Institutional Professional Consult (permissible substitution): Payer: 59 | Admitting: Neurology

## 2014-07-23 ENCOUNTER — Telehealth: Payer: Self-pay | Admitting: Neurology

## 2014-07-26 NOTE — Telephone Encounter (Signed)
done

## 2014-08-05 ENCOUNTER — Encounter: Payer: Self-pay | Admitting: Neurology
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
## 2015-07-22 ENCOUNTER — Emergency Department (HOSPITAL_COMMUNITY)
Admission: EM | Admit: 2015-07-22 | Discharge: 2015-07-22 | Disposition: A | Discharge status: Home / Self Care | Payer: Commercial Managed Care - HMO | Attending: Emergency Medicine | Admitting: Emergency Medicine

## 2015-07-22 ENCOUNTER — Encounter (HOSPITAL_COMMUNITY): Payer: Self-pay | Admitting: Emergency Medicine

## 2015-07-22 ENCOUNTER — Emergency Department (HOSPITAL_COMMUNITY): Payer: Commercial Managed Care - HMO

## 2015-07-22 DIAGNOSIS — R0602 Shortness of breath: Secondary | ICD-10-CM | POA: Insufficient documentation

## 2015-07-22 DIAGNOSIS — M791 Myalgia: Secondary | ICD-10-CM | POA: Diagnosis not present

## 2015-07-22 DIAGNOSIS — Z79899 Other long term (current) drug therapy: Secondary | ICD-10-CM | POA: Diagnosis not present

## 2015-07-22 DIAGNOSIS — Z720 Tobacco use: Secondary | ICD-10-CM | POA: Insufficient documentation

## 2015-07-22 DIAGNOSIS — H9190 Unspecified hearing loss, unspecified ear: Secondary | ICD-10-CM | POA: Insufficient documentation

## 2015-07-22 DIAGNOSIS — R079 Chest pain, unspecified: Secondary | ICD-10-CM | POA: Insufficient documentation

## 2015-07-22 HISTORY — DX: Hemorrhage of anus and rectum: K62.5

## 2015-07-22 HISTORY — DX: Dorsalgia, unspecified: M54.9

## 2015-07-22 LAB — CBC
HCT: 45.5 % (ref 39.0–52.0)
HEMOGLOBIN: 15.3 g/dL (ref 13.0–17.0)
MCH: 29.5 pg (ref 26.0–34.0)
MCHC: 33.6 g/dL (ref 30.0–36.0)
MCV: 87.8 fL (ref 78.0–100.0)
PLATELETS: 262 10*3/uL (ref 150–400)
RBC: 5.18 MIL/uL (ref 4.22–5.81)
RDW: 14.5 % (ref 11.5–15.5)
WBC: 5.8 10*3/uL (ref 4.0–10.5)

## 2015-07-22 LAB — BASIC METABOLIC PANEL
Anion gap: 9 (ref 5–15)
BUN: 14 mg/dL (ref 6–20)
CALCIUM: 9.1 mg/dL (ref 8.9–10.3)
CHLORIDE: 104 mmol/L (ref 101–111)
CO2: 23 mmol/L (ref 22–32)
Creatinine, Ser: 1.25 mg/dL — ABNORMAL HIGH (ref 0.61–1.24)
Glucose, Bld: 110 mg/dL — ABNORMAL HIGH (ref 65–99)
Potassium: 4.8 mmol/L (ref 3.5–5.1)
Sodium: 136 mmol/L (ref 135–145)

## 2015-07-22 LAB — I-STAT TROPONIN, ED
TROPONIN I, POC: 0 ng/mL (ref 0.00–0.08)
TROPONIN I, POC: 0 ng/mL (ref 0.00–0.08)

## 2015-07-22 MED ORDER — OXYCODONE-ACETAMINOPHEN 5-325 MG PO TABS: 1.0000 | ORAL_TABLET | ORAL | Status: DC | PRN

## 2015-07-22 MED ORDER — METHOCARBAMOL 500 MG PO TABS: 500.0000 mg | ORAL_TABLET | Freq: Two times a day (BID) | ORAL | Status: DC

## 2015-07-22 MED ORDER — METHOCARBAMOL 500 MG PO TABS
500.0000 mg | ORAL_TABLET | Freq: Once | ORAL | Status: AC
  Administered 2015-07-22: 500 mg via ORAL
  Filled 2015-07-22: qty 1

## 2015-07-22 MED ORDER — DIAZEPAM 5 MG PO TABS
5.0000 mg | ORAL_TABLET | Freq: Once | ORAL | Status: AC
  Administered 2015-07-22: 5 mg via ORAL
  Filled 2015-07-22: qty 1

## 2015-07-22 MED ORDER — MORPHINE SULFATE 4 MG/ML IJ SOLN
4.0000 mg | Freq: Once | INTRAMUSCULAR | Status: AC
  Administered 2015-07-22: 4 mg via INTRAVENOUS
  Filled 2015-07-22: qty 1

## 2015-07-22 NOTE — Discharge Instructions (Signed)
1. Medications: robaxin, percocet, usual home medications 2. Treatment: rest, drink plenty of fluids, heat, ice 3. Follow Up: please followup with your primary doctor this week for discussion of your diagnoses and further evaluation after today's visit; if you do not have a primary care doctor use the resource guide provided to find one; please return to the ER for fever, chills, lightheadedness, dizziness, severe chest pain or shortness of breath, new or worsening symptoms   Chest Pain (Nonspecific) It is often hard to give a specific diagnosis for the cause of chest pain. There is always a chance that your pain could be related to something serious, such as a heart attack or a blood clot in the lungs. You need to follow up with your health care provider for further evaluation. CAUSES   Heartburn.  Pneumonia or bronchitis.  Anxiety or stress.  Inflammation around your heart (pericarditis) or lung (pleuritis or pleurisy).  A blood clot in the lung.  A collapsed lung (pneumothorax). It can develop suddenly on its own (spontaneous pneumothorax) or from trauma to the chest.  Shingles infection (herpes zoster virus). The chest wall is composed of bones, muscles, and cartilage. Any of these can be the source of the pain.  The bones can be bruised by injury.  The muscles or cartilage can be strained by coughing or overwork.  The cartilage can be affected by inflammation and become sore (costochondritis). DIAGNOSIS  Lab tests or other studies may be needed to find the cause of your pain. Your health care provider may have you take a test called an ambulatory electrocardiogram (ECG). An ECG records your heartbeat patterns over a 24-hour period. You may also have other tests, such as:  Transthoracic echocardiogram (TTE). During echocardiography, sound waves are used to evaluate how blood flows through your heart.  Transesophageal echocardiogram (TEE).  Cardiac monitoring. This allows your  health care provider to monitor your heart rate and rhythm in real time.  Holter monitor. This is a portable device that records your heartbeat and can help diagnose heart arrhythmias. It allows your health care provider to track your heart activity for several days, if needed.  Stress tests by exercise or by giving medicine that makes the heart beat faster. TREATMENT   Treatment depends on what may be causing your chest pain. Treatment may include:  Acid blockers for heartburn.  Anti-inflammatory medicine.  Pain medicine for inflammatory conditions.  Antibiotics if an infection is present.  You may be advised to change lifestyle habits. This includes stopping smoking and avoiding alcohol, caffeine, and chocolate.  You may be advised to keep your head raised (elevated) when sleeping. This reduces the chance of acid going backward from your stomach into your esophagus. Most of the time, nonspecific chest pain will improve within 2-3 days with rest and mild pain medicine.  HOME CARE INSTRUCTIONS   If antibiotics were prescribed, take them as directed. Finish them even if you start to feel better.  For the next few days, avoid physical activities that bring on chest pain. Continue physical activities as directed.  Do not use any tobacco products, including cigarettes, chewing tobacco, or electronic cigarettes.  Avoid drinking alcohol.  Only take medicine as directed by your health care provider.  Follow your health care provider's suggestions for further testing if your chest pain does not go away.  Keep any follow-up appointments you made. If you do not go to an appointment, you could develop lasting (chronic) problems with pain. If there is  any problem keeping an appointment, call to reschedule. SEEK MEDICAL CARE IF:   Your chest pain does not go away, even after treatment.  You have a rash with blisters on your chest.  You have a fever. SEEK IMMEDIATE MEDICAL CARE IF:    You have increased chest pain or pain that spreads to your arm, neck, jaw, back, or abdomen.  You have shortness of breath.  You have an increasing cough, or you cough up blood.  You have severe back or abdominal pain.  You feel nauseous or vomit.  You have severe weakness.  You faint.  You have chills. This is an emergency. Do not wait to see if the pain will go away. Get medical help at once. Call your local emergency services (911 in [place] Do not drive yourself to the hospital. MAKE SURE YOU:   Understand these instructions.  Will watch your condition.  Will get help right away if you are not doing well or get worse. Document Released: 09/05/2005 Document Revised: 12/01/2013 Document Reviewed: 07/01/2008 [facility] Patient Information 2015 [place], [place]. This information is not intended to replace advice given to you by your health care provider. Make sure you discuss any questions you have with your health care provider.  Musculoskeletal Pain Musculoskeletal pain is muscle and boney aches and pains. These pains can occur in any part of the body. Your caregiver may treat you without knowing the cause of the pain. They may treat you if blood or urine tests, X-rays, and other tests were normal.  CAUSES There is often not a definite cause or reason for these pains. These pains may be caused by a type of germ (virus). The discomfort may also come from overuse. Overuse includes working out too hard when your body is not fit. Boney aches also come from weather changes. Bone is sensitive to atmospheric pressure changes. HOME CARE INSTRUCTIONS   Ask when your test results will be ready. Make sure you get your test results.  Only take over-the-counter or prescription medicines for pain, discomfort, or fever as directed by your caregiver. If you were given medications for your condition, do not drive, operate machinery or power tools, or sign legal documents for 24 hours. Do not drink  alcohol. Do not take sleeping pills or other medications that may interfere with treatment.  Continue all activities unless the activities cause more pain. When the pain lessens, slowly resume normal activities. Gradually increase the intensity and duration of the activities or exercise.  During periods of severe pain, bed rest may be helpful. Lay or sit in any position that is comfortable.  Putting ice on the injured area.  Put ice in a bag.  Place a towel between your skin and the bag.  Leave the ice on for 15 to 20 minutes, 3 to 4 times a day.  Follow up with your caregiver for continued problems and no reason can be found for the pain. If the pain becomes worse or does not go away, it may be necessary to repeat tests or do additional testing. Your caregiver may need to look further for a possible cause. SEEK IMMEDIATE MEDICAL CARE IF:  You have pain that is getting worse and is not relieved by medications.  You develop chest pain that is associated with shortness or breath, sweating, feeling sick to your stomach (nauseous), or throw up (vomit).  Your pain becomes localized to the abdomen.  You develop any new symptoms that seem different or that concern you.  MAKE SURE YOU:   Understand these instructions.  Will watch your condition.  Will get help right away if you are not doing well or get worse. Document Released: 11/26/2005 Document Revised: 02/18/2012 Document Reviewed: 07/31/2013 [facility] Patient Information 2015 [place], [place]. This information is not intended to replace advice given to you by your health care provider. Make sure you discuss any questions you have with your health care provider.

## 2015-07-22 NOTE — ED Provider Notes (Signed)
CSN: [id number]     Arrival date & time 07/22/15  0815 History   First MD Initiated Contact with Patient 07/22/15 [phone]     Chief Complaint  Patient presents with  . Chest Pain   HPI   Roy Bryant is a 54 y.o. male with no significant past medical history who reports to the ED with right chest pain. He states for the past month, he has experienced right arm pain radiating to his right chest, however he reports this morning, his right chest pain was markedly worse, which prompted him to come to the ED. He states his pain began when he was lying in bed. He reports history of back spasms, and states that this feels similar. Denies numbness, paresthesia, or pain to upper extremities. Reports shortness of breath, which he attributes to pain. Denies cough, congestion, recent illness. Denies leg swelling, history of DVT, history of malignancy, recent travel or immobility. Denies recent injury.    Past Medical History  Diagnosis Date  . Hearing loss   . Medical history non-contributory   . Snores   . Wears hearing aid     right ear   Past Surgical History  Procedure Laterality Date  . Wisdom tooth extraction    . Colonoscopy    . 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 Diabetes Daughter    Social History  Substance Use Topics  . Smoking status: Current Some Day Smoker    Types: Cigarettes  . Smokeless tobacco: Not on file  . Alcohol Use: 0.6 oz/week    1 Cans of beer per week     Comment: occ    Review of Systems  Constitutional: Negative for fever, chills, diaphoresis, activity change, appetite change and fatigue.  HENT: Negative for congestion.   Respiratory: Positive for shortness of breath. Negative for cough.        Reports shortness of breath, which he attributes  to pain.  Cardiovascular: Positive for chest pain. Negative for palpitations and leg swelling.       Reports right sided chest pain. Denies radiation.  Gastrointestinal: Negative for nausea, vomiting, abdominal pain, diarrhea, constipation and abdominal distention.  Musculoskeletal: Positive for myalgias. Negative for back pain, arthralgias, neck pain and neck stiffness.       Reports muscle spasm to right chest.  Skin: Negative for color change, pallor, rash and wound.  Neurological: Negative for dizziness, syncope, weakness, light-headedness, numbness and headaches.     Allergies  Review of patient's allergies indicates no known allergies.  Home Medications   Prior to Admission medications   Medication Sig Start Date End Date Taking? Authorizing Provider  diazepam (VALIUM) 5 MG tablet Take 1 tablet (5 mg total) by mouth every 6 (six) hours as needed for muscle spasms. 12/26/13   Hannah Muthersbaugh, PA-C  meloxicam (MOBIC) 15 MG tablet Take 15 mg by mouth once.    Historical Provider, MD  naproxen sodium (ANAPROX) 550 MG tablet Take 550 mg by mouth 2 (two) times daily as needed for mild pain.    Historical Provider, MD  oxyCODONE-acetaminophen (PERCOCET/ROXICET) 5-325 MG per tablet Take 1-2 tablets by mouth every 4 (four) hours as needed for severe pain. 12/26/13   Hannah Muthersbaugh, PA-C    There were no vitals taken  for this visit. Physical Exam  Constitutional: He is oriented to person, place, and time. He appears well-developed and well-nourished. No distress.  HENT:  Head: Normocephalic and atraumatic.  Right Ear: External ear normal.  Left Ear: External ear normal.  Nose: Nose normal.  Mouth/Throat: Oropharynx is clear and moist.  Eyes: Conjunctivae and EOM are normal. Pupils are equal, round, and reactive to light.  Neck: Normal range of motion. Neck supple.  Cardiovascular: Normal rate, regular rhythm, normal heart sounds and intact distal pulses.   Pulmonary/Chest:  Effort normal and breath sounds normal. No respiratory distress. He has no wheezes. He has no rales. He exhibits no tenderness.  Right chest wall non-tender to palpation. Palpable muscle spasm to pectoralis major.  Abdominal: Soft. Bowel sounds are normal. He exhibits no distension and no mass. There is no tenderness. There is no rebound and no guarding.  Musculoskeletal: Normal range of motion.  Full range of motion of right upper extremity with flexion, extension, abduction, internal and external rotation. Limited range of motion due to pain with crossing midline.  Neurological: He is alert and oriented to person, place, and time. He has normal strength. No sensory deficit.  Skin: Skin is warm and dry. No rash noted. He is not diaphoretic. No erythema. No pallor.  Psychiatric: He has a normal mood and affect. His behavior is normal.  Nursing note and vitals reviewed.   ED Course  Procedures (including critical care time)  Labs Review Labs Reviewed  BASIC METABOLIC PANEL - Abnormal; Notable for the following:    Glucose, Bld 110 (*)    Creatinine, Ser 1.25 (*)    All other components within normal limits  CBC  I-STAT TROPOININ, ED  Randolm Idol, ED    Imaging Review Dg Chest 2 View  07/22/2015   CLINICAL DATA:  Sharp right-sided chest pain for 2 weeks. Increased today.  EXAM: CHEST  2 VIEW  COMPARISON:  12/26/2013.  FINDINGS: Cardiac silhouette normal in size and configuration. No mediastinal or hilar masses or evidence of adenopathy.  Chronic bronchial wall thickening in the medial lower lobes, stable. No lung consolidation or edema. No pleural effusion or pneumothorax.  Skeletal structures are unremarkable.  IMPRESSION: No acute cardiopulmonary disease.   Electronically Signed   By: Lajean Manes M.D.   On: 07/22/2015 09:02     I, Marella Chimes, personally reviewed and evaluated these images and lab results as part of my medical decision-making.   EKG  Interpretation   Date/Time:  Friday July 22 2015 08:19:13 EDT Ventricular Rate:  76 PR Interval:  149 QRS Duration: 94 QT Interval:  354 QTC Calculation: 398 R Axis:   -35 Text Interpretation:  Sinus rhythm Minimal ST elevation, anterior leads No  significant change since last tracing Confirmed by Hazle Coca [phone]) on  07/22/2015 8:25:05 AM      MDM   Final diagnoses:  Right-sided chest pain    54 year old male presents with right chest pain. Palpable muscle spasm to right pectoralis major. Reports pain 2/10 at baseline, 10/10 with spasm. Full range of motion of right upper extremity with flexion, extension, abduction, internal and external rotation. Limited range of motion due to pain with crossing midline. Strength and sensation intact. Reports shortness of breath with pain. Denies leg swelling, history of DVT, history of malignancy, recent travel or immobility. No evidence for PE. EKG no acute ischemia. Troponin negative x 2. Doubt ACS. No cough, congestion. Patient is afebrile. CXR demonstrates no  acute cardiopulmonary abnormality. No evidence for pneumonia.  Robaxin given for muscle spasm with no improvement. Diazepam given. Morphine given, with improvement in pain.  Patient discussed with and seen by Montine Circle, PA-C and Dr. Ralene Bathe.   Repeat troponin negative. No evidence for ACS. Right sided chest pain likely due to muscle spasm. Will discharge home with muscle relaxant and pain medication. Return precautions discussed. Patient to follow up with PCP this week.    BP 138/89 mmHg  Pulse 69  Temp(Src) 98 F (36.7 C) (Oral)  Resp 15  SpO2 99%     Marella Chimes, PA-C 07/22/15 [place], MD 07/22/15 [phone]

## 2015-07-22 NOTE — ED Notes (Signed)
Pt from home via [facility] with c/o right arm pain radiating to right chest x 1 month.  Pt reports not being evaluated for the same.  Pain gradually centralized to right chest and central chest.  Pt reports it feels like spasms, similar to when he had back spasms worsening pain today and SOB.  EMS reports pt becomes diaphoretic when spasm occurs.  Pt denies any cardiac history.  Pain is not reproducible, 12 lead unremarkable, given 324 mg aspirin, 1 nitro with no change in pain.  Pt in NAD, A&O.

## 2016-07-06 ENCOUNTER — Ambulatory Visit
Admission: RE | Admit: 2016-07-06 | Discharge: 2016-07-06 | Disposition: A | Discharge status: Home / Self Care | Payer: Commercial Managed Care - HMO | Source: Ambulatory Visit | Attending: Family Medicine | Admitting: Family Medicine

## 2016-07-06 ENCOUNTER — Other Ambulatory Visit: Payer: Self-pay | Admitting: Family Medicine

## 2016-07-06 DIAGNOSIS — M25532 Pain in left wrist: Secondary | ICD-10-CM

## 2016-08-24 DIAGNOSIS — H90A31 Mixed conductive and sensorineural hearing loss, unilateral, right ear with restricted hearing on the contralateral side: Secondary | ICD-10-CM | POA: Insufficient documentation

## 2016-12-13 DIAGNOSIS — M19032 Primary osteoarthritis, left wrist: Secondary | ICD-10-CM | POA: Diagnosis not present

## 2016-12-21 DIAGNOSIS — H9012 Conductive hearing loss, unilateral, left ear, with unrestricted hearing on the contralateral side: Secondary | ICD-10-CM | POA: Diagnosis not present

## 2016-12-21 DIAGNOSIS — H8003 Otosclerosis involving oval window, nonobliterative, bilateral: Secondary | ICD-10-CM | POA: Diagnosis not present

## 2017-01-04 DIAGNOSIS — H8093 Unspecified otosclerosis, bilateral: Secondary | ICD-10-CM | POA: Insufficient documentation

## 2017-05-10 DIAGNOSIS — R030 Elevated blood-pressure reading, without diagnosis of hypertension: Secondary | ICD-10-CM | POA: Diagnosis not present

## 2017-05-10 DIAGNOSIS — R5383 Other fatigue: Secondary | ICD-10-CM | POA: Diagnosis not present

## 2017-05-10 DIAGNOSIS — N183 Chronic kidney disease, stage 3 (moderate): Secondary | ICD-10-CM | POA: Diagnosis not present

## 2017-06-28 DIAGNOSIS — N183 Chronic kidney disease, stage 3 (moderate): Secondary | ICD-10-CM | POA: Diagnosis not present

## 2017-06-28 DIAGNOSIS — I10 Essential (primary) hypertension: Secondary | ICD-10-CM | POA: Diagnosis not present

## 2017-10-25 DIAGNOSIS — M7712 Lateral epicondylitis, left elbow: Secondary | ICD-10-CM | POA: Diagnosis not present

## 2017-10-25 DIAGNOSIS — M25532 Pain in left wrist: Secondary | ICD-10-CM | POA: Diagnosis not present

## 2017-10-25 DIAGNOSIS — G8929 Other chronic pain: Secondary | ICD-10-CM | POA: Diagnosis not present

## 2018-01-31 DIAGNOSIS — Z125 Encounter for screening for malignant neoplasm of prostate: Secondary | ICD-10-CM | POA: Diagnosis not present

## 2018-01-31 DIAGNOSIS — I10 Essential (primary) hypertension: Secondary | ICD-10-CM | POA: Diagnosis not present

## 2018-01-31 DIAGNOSIS — Z13220 Encounter for screening for lipoid disorders: Secondary | ICD-10-CM | POA: Diagnosis not present

## 2018-01-31 DIAGNOSIS — J4520 Mild intermittent asthma, uncomplicated: Secondary | ICD-10-CM | POA: Diagnosis not present

## 2018-01-31 DIAGNOSIS — N183 Chronic kidney disease, stage 3 (moderate): Secondary | ICD-10-CM | POA: Diagnosis not present

## 2018-01-31 DIAGNOSIS — Z0000 Encounter for general adult medical examination without abnormal findings: Secondary | ICD-10-CM | POA: Diagnosis not present

## 2018-01-31 DIAGNOSIS — M25532 Pain in left wrist: Secondary | ICD-10-CM | POA: Diagnosis not present

## 2018-02-21 DIAGNOSIS — D126 Benign neoplasm of colon, unspecified: Secondary | ICD-10-CM | POA: Diagnosis not present

## 2018-02-21 DIAGNOSIS — Z86010 Personal history of colonic polyps: Secondary | ICD-10-CM | POA: Diagnosis not present

## 2018-02-21 DIAGNOSIS — K648 Other hemorrhoids: Secondary | ICD-10-CM | POA: Diagnosis not present

## 2018-03-07 DIAGNOSIS — M25332 Other instability, left wrist: Secondary | ICD-10-CM | POA: Diagnosis not present

## 2018-03-07 DIAGNOSIS — M25632 Stiffness of left wrist, not elsewhere classified: Secondary | ICD-10-CM | POA: Diagnosis not present

## 2018-03-07 DIAGNOSIS — M25532 Pain in left wrist: Secondary | ICD-10-CM | POA: Diagnosis not present

## 2018-04-04 DIAGNOSIS — H9012 Conductive hearing loss, unilateral, left ear, with unrestricted hearing on the contralateral side: Secondary | ICD-10-CM | POA: Diagnosis not present

## 2018-04-04 DIAGNOSIS — H90A11 Conductive hearing loss, unilateral, right ear with restricted hearing on the contralateral side: Secondary | ICD-10-CM | POA: Diagnosis not present

## 2018-04-04 DIAGNOSIS — H8093 Unspecified otosclerosis, bilateral: Secondary | ICD-10-CM | POA: Diagnosis not present

## 2018-04-04 DIAGNOSIS — H9193 Unspecified hearing loss, bilateral: Secondary | ICD-10-CM | POA: Diagnosis not present

## 2018-04-04 DIAGNOSIS — H9192 Unspecified hearing loss, left ear: Secondary | ICD-10-CM | POA: Diagnosis not present

## 2018-05-09 DIAGNOSIS — I10 Essential (primary) hypertension: Secondary | ICD-10-CM | POA: Diagnosis not present

## 2018-05-09 DIAGNOSIS — N529 Male erectile dysfunction, unspecified: Secondary | ICD-10-CM | POA: Diagnosis not present

## 2018-05-09 DIAGNOSIS — J4520 Mild intermittent asthma, uncomplicated: Secondary | ICD-10-CM | POA: Diagnosis not present

## 2018-05-23 DIAGNOSIS — M19232 Secondary osteoarthritis, left wrist: Secondary | ICD-10-CM | POA: Diagnosis not present

## 2018-05-23 DIAGNOSIS — M25532 Pain in left wrist: Secondary | ICD-10-CM | POA: Diagnosis not present

## 2018-06-02 DIAGNOSIS — M25562 Pain in left knee: Secondary | ICD-10-CM | POA: Diagnosis not present

## 2018-06-19 DIAGNOSIS — R0681 Apnea, not elsewhere classified: Secondary | ICD-10-CM | POA: Diagnosis not present

## 2018-06-30 ENCOUNTER — Ambulatory Visit
Admission: RE | Admit: 2018-06-30 | Discharge: 2018-06-30 | Disposition: A | Discharge status: Home / Self Care | Payer: Self-pay | Source: Ambulatory Visit | Attending: Family Medicine | Admitting: Family Medicine

## 2018-06-30 ENCOUNTER — Other Ambulatory Visit: Payer: Self-pay | Admitting: Family Medicine

## 2018-06-30 DIAGNOSIS — M542 Cervicalgia: Secondary | ICD-10-CM

## 2018-07-30 DIAGNOSIS — G4733 Obstructive sleep apnea (adult) (pediatric): Secondary | ICD-10-CM | POA: Diagnosis not present

## 2018-08-22 DIAGNOSIS — G4733 Obstructive sleep apnea (adult) (pediatric): Secondary | ICD-10-CM | POA: Diagnosis not present

## 2018-08-29 DIAGNOSIS — M542 Cervicalgia: Secondary | ICD-10-CM | POA: Diagnosis not present

## 2018-09-12 DIAGNOSIS — J309 Allergic rhinitis, unspecified: Secondary | ICD-10-CM | POA: Diagnosis not present

## 2018-09-12 DIAGNOSIS — J019 Acute sinusitis, unspecified: Secondary | ICD-10-CM | POA: Diagnosis not present

## 2018-09-21 DIAGNOSIS — G4733 Obstructive sleep apnea (adult) (pediatric): Secondary | ICD-10-CM | POA: Diagnosis not present

## 2018-09-26 DIAGNOSIS — G4733 Obstructive sleep apnea (adult) (pediatric): Secondary | ICD-10-CM | POA: Diagnosis not present

## 2018-10-03 DIAGNOSIS — M19132 Post-traumatic osteoarthritis, left wrist: Secondary | ICD-10-CM | POA: Insufficient documentation

## 2018-10-03 DIAGNOSIS — M542 Cervicalgia: Secondary | ICD-10-CM | POA: Diagnosis not present

## 2018-10-22 DIAGNOSIS — G4733 Obstructive sleep apnea (adult) (pediatric): Secondary | ICD-10-CM | POA: Diagnosis not present

## 2018-11-13 DIAGNOSIS — G4733 Obstructive sleep apnea (adult) (pediatric): Secondary | ICD-10-CM | POA: Diagnosis not present

## 2018-11-21 DIAGNOSIS — G4733 Obstructive sleep apnea (adult) (pediatric): Secondary | ICD-10-CM | POA: Diagnosis not present

## 2018-11-24 DIAGNOSIS — G4733 Obstructive sleep apnea (adult) (pediatric): Secondary | ICD-10-CM | POA: Diagnosis not present

## 2018-12-22 DIAGNOSIS — G4733 Obstructive sleep apnea (adult) (pediatric): Secondary | ICD-10-CM | POA: Diagnosis not present

## 2019-01-13 ENCOUNTER — Other Ambulatory Visit: Payer: Self-pay | Admitting: Family Medicine

## 2019-01-13 ENCOUNTER — Ambulatory Visit
Admission: RE | Admit: 2019-01-13 | Discharge: 2019-01-13 | Disposition: A | Discharge status: Home / Self Care | Payer: 59 | Source: Ambulatory Visit | Attending: Family Medicine | Admitting: Family Medicine

## 2019-01-13 DIAGNOSIS — R1032 Left lower quadrant pain: Secondary | ICD-10-CM

## 2019-01-13 DIAGNOSIS — M25552 Pain in left hip: Secondary | ICD-10-CM | POA: Diagnosis not present

## 2019-01-22 DIAGNOSIS — G4733 Obstructive sleep apnea (adult) (pediatric): Secondary | ICD-10-CM | POA: Diagnosis not present

## 2019-02-20 DIAGNOSIS — G4733 Obstructive sleep apnea (adult) (pediatric): Secondary | ICD-10-CM | POA: Diagnosis not present

## 2019-03-23 DIAGNOSIS — G4733 Obstructive sleep apnea (adult) (pediatric): Secondary | ICD-10-CM | POA: Diagnosis not present

## 2019-04-22 DIAGNOSIS — G4733 Obstructive sleep apnea (adult) (pediatric): Secondary | ICD-10-CM | POA: Diagnosis not present

## 2019-04-24 DIAGNOSIS — M25561 Pain in right knee: Secondary | ICD-10-CM | POA: Diagnosis not present

## 2020-03-31 ENCOUNTER — Ambulatory Visit: Payer: 59 | Attending: Internal Medicine

## 2020-03-31 DIAGNOSIS — Z23 Encounter for immunization: Secondary | ICD-10-CM

## 2020-03-31 NOTE — Progress Notes (Signed)
   Covid-19 Vaccination Clinic  Name:  Roy Bryant    MRN: [id number] DOB: 1961-05-18  03/31/2020  Roy Bryant was observed post Covid-19 immunization for 15 minutes without incident. He was provided with Vaccine Information Sheet and instruction to access the V-Safe system.   Roy Bryant was instructed to call 911 with any severe reactions post vaccine: Marland Kitchen Difficulty breathing  . Swelling of face and throat  . A fast heartbeat  . A bad rash all over body  . Dizziness and weakness   Immunizations Administered    Name Date Dose VIS Date Route   Pfizer COVID-19 Vaccine 03/31/2020  1:38 PM 0.3 mL 02/03/2019 Intramuscular   Manufacturer: [place]   Lot: [id number]   [place]: [id number]

## 2020-04-25 ENCOUNTER — Ambulatory Visit: Payer: 59 | Attending: Internal Medicine

## 2020-04-25 DIAGNOSIS — Z23 Encounter for immunization: Secondary | ICD-10-CM

## 2020-04-25 NOTE — Progress Notes (Signed)
   Covid-19 Vaccination Clinic  Name:  Roy Bryant    MRN: [id number] DOB: 04/10/1961  04/25/2020  [name] was observed post Covid-19 immunization for 15 minutes without incident. He was provided with Vaccine Information Sheet and instruction to access the V-Safe system.   [name] was instructed to call 911 with any severe reactions post vaccine: Marland Kitchen Difficulty breathing  . Swelling of face and throat  . A fast heartbeat  . A bad rash all over body  . Dizziness and weakness   Immunizations Administered    Name Date Dose VIS Date Route   Pfizer COVID-19 Vaccine 04/25/2020  1:39 PM 0.3 mL 02/03/2019 Intramuscular   Manufacturer: [place]   Lot: [id number]   [place]: [id number]

## 2021-01-24 DIAGNOSIS — H9312 Tinnitus, left ear: Secondary | ICD-10-CM | POA: Insufficient documentation

## 2022-03-27 ENCOUNTER — Ambulatory Visit
Admission: RE | Admit: 2022-03-27 | Discharge: 2022-03-27 | Disposition: A | Discharge status: Home / Self Care | Payer: 59 | Source: Ambulatory Visit | Attending: Gastroenterology | Admitting: Gastroenterology

## 2022-03-27 ENCOUNTER — Other Ambulatory Visit: Payer: Self-pay | Admitting: Gastroenterology

## 2022-03-27 DIAGNOSIS — K219 Gastro-esophageal reflux disease without esophagitis: Secondary | ICD-10-CM

## 2022-05-30 ENCOUNTER — Other Ambulatory Visit: Payer: Self-pay | Admitting: Internal Medicine

## 2022-05-30 DIAGNOSIS — I10 Essential (primary) hypertension: Secondary | ICD-10-CM

## 2022-07-18 ENCOUNTER — Other Ambulatory Visit: Payer: Self-pay

## 2022-08-10 ENCOUNTER — Other Ambulatory Visit: Payer: Self-pay

## 2022-09-12 ENCOUNTER — Ambulatory Visit: Payer: Self-pay | Admitting: Family Medicine

## 2022-09-12 NOTE — Progress Notes (Deleted)
   I, [name], LAT, ATC acting as a scribe for Lynne Leader, MD.  Subjective:    CC: Bilat knee pain  HPI: Pt is a 61 y/o male c/o bilat knee pain x /. Pt locates pain to   Knee swelling: Mechanical symptoms: Radiates: Aggravates: Treatments tried:  Pertinent review of Systems: ***  Relevant historical information: ***   Objective:   There were no vitals filed for this visit. General: Well Developed, well nourished, and in no acute distress.   MSK: ***  Lab and Radiology Results No results found for this or any previous visit (from the past 72 hour(s)). No results found.    Impression and Recommendations:    Assessment and Plan: 61 y.o. male with ***.  PDMP not reviewed this encounter. No orders of the defined types were placed in this encounter.  No orders of the defined types were placed in this encounter.   Discussed warning signs or symptoms. Please see discharge instructions. Patient expresses understanding.   ***

## 2022-09-13 NOTE — Progress Notes (Signed)
I, Roy Bryant, LAT, ATC acting as a scribe for Roy Leader, MD.  Subjective:    CC: Bilat knee pain  HPI: Pt is a 61 y/o male c/o bilat knee pain that's chronic in nature, 20-30 years. 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 Pt reports pain has really worsened over the past 8 years, R>L. Pt locates pain to the lateral aspect of both knees, along the fibular head.  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   Objective:    Vitals:   09/14/22 0915  BP: (!) 184/92  Pulse: 73  SpO2: 100%   General: Well Developed, well nourished, and in no acute distress.   MSK: Bilateral knees relatively normal-appearing with mild joint effusion.  Normal motion with crepitation.  Tender palpation lateral joint line. Intact strength.  Lab and Radiology Results   Procedure: Real-time Ultrasound Guided Injection of right knee superior lateral patellar space Device: Philips Affiniti 50G Images permanently stored and available for review in PACS Verbal informed consent obtained.  Discussed risks and benefits of procedure. Warned about infection, bleeding, hyperglycemia damage to structures among others. Patient expresses understanding and agreement Time-out conducted.   Noted no overlying erythema, induration, or other signs of local infection.   Skin prepped in a sterile fashion.   Local anesthesia: Topical Ethyl chloride.   With sterile technique and under real time ultrasound guidance: 40 mg of Kenalog and 2 mL of Marcaine injected into knee joint. Fluid seen entering the joint capsule.   Completed without difficulty   Pain immediately resolved suggesting  accurate placement of the medication.   Advised to call if fevers/chills, erythema, induration, drainage, or persistent bleeding.   Images permanently stored and available for review in the ultrasound unit.  Impression: Technically successful ultrasound guided injection.    Procedure: Real-time Ultrasound Guided Injection of left knee superior lateral patellar space Device: Philips Affiniti 50G Images permanently stored and available for review in PACS Verbal informed consent obtained.  Discussed risks and benefits of procedure. Warned about infection, bleeding, hyperglycemia damage to structures among others. Patient expresses understanding and agreement Time-out conducted.   Noted no overlying erythema, induration, or other signs of local infection.   Skin prepped in a sterile fashion.   Local anesthesia: Topical Ethyl chloride.   With sterile technique and under real time ultrasound guidance: 40 mg of Kenalog and 2 mL of Marcaine injected into knee joint. Fluid seen entering the joint capsule.   Completed without difficulty   Pain immediately resolved suggesting accurate placement of the medication.   Advised to call if fevers/chills, erythema, induration, drainage, or persistent bleeding.   Images permanently stored and available for review in the ultrasound unit.  Impression: Technically successful ultrasound guided injection.      No results found for this or any previous visit (from the past 72 hour(s)). DG Knee AP/LAT W/Sunrise Right  Result Date: 09/15/2022 CLINICAL DATA:  Chronic bilateral knee pain. EXAM: RIGHT KNEE 3 VIEWS COMPARISON:  None Available. FINDINGS: Bone mineralization is subjectively normal. There is mild lateral tibiofemoral joint space narrowing. Mild tricompartmental peripheral spurring and spurring of the tibial spines. No fracture. No erosion or focal bone abnormality. No knee joint effusion.  No focal soft tissue abnormalities are seen. IMPRESSION: Mild  tricompartmental osteoarthritis. Electronically Signed   By: Keith Rake M.D.   On: 09/15/2022 17:14   DG Knee AP/LAT W/Sunrise Left  Result Date: 09/15/2022 CLINICAL DATA:  Chronic bilateral knee pain. EXAM: LEFT KNEE 3 VIEWS COMPARISON:  None Available. FINDINGS: Bone mineralization is subjectively normal. Normal alignment. The joint spaces are preserved. There is trace patellofemoral spurring. No fracture. No erosion or evidence of focal bone abnormality. No knee joint effusion. No focal soft tissue abnormalities are seen. IMPRESSION: Trace patellofemoral osteoarthritis. Electronically Signed   By: Keith Rake M.D.   On: 09/15/2022 17:13    I, Roy Bryant, personally (independently) visualized and performed the interpretation of the images attached in this note.   Impression and Recommendations:    Assessment and Plan: 61 y.o. male with bilateral knee pain thought to be due to DJD.  He likely does have a component of degenerative meniscus tear as well.  Plan for steroid injection today.  Also recommend Voltaren gel.  Check back as needed.Roy Bryant  PDMP not reviewed this encounter. Orders Placed This Encounter  Procedures   DG Knee AP/LAT W/Sunrise Left    Standing Status:   Future    Number of Occurrences:   1    Standing Expiration Date:   10/15/2022    Order Specific Question:   Reason for Exam (SYMPTOM  OR DIAGNOSIS REQUIRED)    Answer:   bilateral knee pain    Order Specific Question:   Preferred imaging location?    Answer:   [facility]   DG Knee AP/LAT W/Sunrise Right    Standing Status:   Future    Number of Occurrences:   1    Standing Expiration Date:   10/15/2022    Order Specific Question:   Reason for Exam (SYMPTOM  OR DIAGNOSIS REQUIRED)    Answer:   bilateral knee pain    Order Specific Question:   Preferred imaging location?    Answer:   [facility]   No orders of the defined types were placed in this encounter.   Discussed warning signs or  symptoms. Please see discharge instructions. Patient expresses understanding.   The above documentation has been reviewed and is accurate and complete Roy Bryant, M.D.

## 2022-09-14 ENCOUNTER — Ambulatory Visit (INDEPENDENT_AMBULATORY_CARE_PROVIDER_SITE_OTHER): Payer: Commercial Managed Care - PPO

## 2022-09-14 ENCOUNTER — Ambulatory Visit: Payer: Commercial Managed Care - PPO | Admitting: Family Medicine

## 2022-09-14 VITALS — BP 184/92 | HR 73 | Ht 70.0 in | Wt 195.0 lb

## 2022-09-14 DIAGNOSIS — G8929 Other chronic pain: Secondary | ICD-10-CM | POA: Diagnosis not present

## 2022-09-14 DIAGNOSIS — M25562 Pain in left knee: Secondary | ICD-10-CM | POA: Diagnosis not present

## 2022-09-14 DIAGNOSIS — M25561 Pain in right knee: Secondary | ICD-10-CM

## 2022-09-14 NOTE — Patient Instructions (Addendum)
Thank you for coming in today.   You received an injection today. Seek immediate medical attention if the joint becomes red, extremely painful, or is oozing fluid.   Please use Voltaren gel (Generic Diclofenac Gel) up to 4x daily for pain as needed.  This is available over-the-counter as both the name brand Voltaren gel and the generic diclofenac gel.   Recheck as needed.

## 2022-09-17 NOTE — Progress Notes (Signed)
Right knee x-ray shows some mild arthritis

## 2022-09-17 NOTE — Progress Notes (Signed)
Left knee x-ray shows some arthritis.

## 2023-03-21 ENCOUNTER — Other Ambulatory Visit (HOSPITAL_COMMUNITY): Payer: Self-pay | Admitting: Internal Medicine

## 2023-03-21 DIAGNOSIS — I10 Essential (primary) hypertension: Secondary | ICD-10-CM

## 2023-04-01 ENCOUNTER — Ambulatory Visit (HOSPITAL_BASED_OUTPATIENT_CLINIC_OR_DEPARTMENT_OTHER): Payer: Commercial Managed Care - PPO

## 2023-04-16 ENCOUNTER — Encounter (HOSPITAL_COMMUNITY): Payer: Self-pay

## 2023-04-16 ENCOUNTER — Other Ambulatory Visit (HOSPITAL_COMMUNITY): Payer: Commercial Managed Care - PPO

## 2023-05-29 ENCOUNTER — Encounter (HOSPITAL_COMMUNITY): Payer: Self-pay

## 2023-05-29 ENCOUNTER — Ambulatory Visit (HOSPITAL_COMMUNITY)
Admission: RE | Admit: 2023-05-29 | Discharge: 2023-05-29 | Disposition: A | Discharge status: Home / Self Care | Payer: Commercial Managed Care - PPO | Source: Ambulatory Visit | Attending: Internal Medicine | Admitting: Internal Medicine

## 2023-05-29 DIAGNOSIS — I10 Essential (primary) hypertension: Secondary | ICD-10-CM | POA: Insufficient documentation

## 2023-06-05 ENCOUNTER — Institutional Professional Consult (permissible substitution): Payer: Commercial Managed Care - PPO | Admitting: Primary Care

## 2023-07-08 ENCOUNTER — Encounter: Payer: Self-pay | Admitting: Family Medicine

## 2023-07-08 ENCOUNTER — Ambulatory Visit: Payer: Commercial Managed Care - PPO | Admitting: Family Medicine

## 2023-07-08 ENCOUNTER — Other Ambulatory Visit: Payer: Self-pay

## 2023-07-08 VITALS — BP 138/88 | HR 70 | Ht 70.0 in | Wt 199.0 lb

## 2023-07-08 DIAGNOSIS — M25521 Pain in right elbow: Secondary | ICD-10-CM

## 2023-07-08 DIAGNOSIS — G8929 Other chronic pain: Secondary | ICD-10-CM

## 2023-07-08 DIAGNOSIS — M25562 Pain in left knee: Secondary | ICD-10-CM

## 2023-07-08 NOTE — Progress Notes (Signed)
I, Stevenson Clinch, CMA acting as a scribe for Clementeen Graham, MD.  Roy Bryant is a 62 y.o. male who presents to [facility] at [facility] today for left knee pain. Pt was last seen by Dr. Denyse Amass on 09/14/22 for BILAT knee pain, likely does have a component of degenerative meniscus tear as well. XR Bilat knees performed as well as BILAT intra-articular Kenalog injections. Voltaren Gel was recommended for pain prn.   Today pt reports continued pain in the left knee. Some swelling present. Minimal relief with Voltaren Gel. Denies mechanical sx. Notes burning sensation at lateral aspect of the high, thinks related to sciatica. Denies new injury.   Pt also c/o right elbow pain. Pt is RHD. C/O right elbow pain x 3 weeks, past injury while employed by the [employer] [place]. Had 2 injections 8-9 years ago, god relief of sx until then. Pain at lateral aspect. Some weakness when pulling down. Sx worse with flexion. Denies swelling.   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   MSK: Right elbow normal appearing Tender palpation at lateral elbow. Normal elbow motion. Strength is intact.  Pain is present with resisted elbow extension.  Patient does not have much pain with resisted wrist and finger extension. Pulses capillary refill and sensation are intact distally.  Left knee: Anterior scar is present overlying the patella. Some bossing present at lateral joint line.  Otherwise normal. Normal motion with crepitation. Tender palpation lateral joint line tenderness Positive lateral McMurray's test. Intact strength.    Lab and Radiology Results  Procedure: Real-time  Ultrasound Guided Injection of left knee joint superior lateral patella space Device: Philips Affiniti 50G/GE Logiq Images permanently stored and available for review in PACS Verbal informed consent obtained.  Discussed risks and benefits of procedure. Warned about infection, bleeding, hyperglycemia damage to structures among others. Patient expresses understanding and agreement Time-out conducted.   Noted no overlying erythema, induration, or other signs of local infection.   Skin prepped in a sterile fashion.   Local anesthesia: Topical Ethyl chloride.   With sterile technique and under real time ultrasound guidance: 40 mg of Kenalog and 2 mL of Marcaine injected into knee joint. Fluid seen entering the joint capsule.   Completed without difficulty   Pain immediately resolved suggesting accurate placement of the medication.   Advised to call if fevers/chills, erythema, induration, drainage, or persistent bleeding.   Images permanently stored and available for review in the ultrasound unit.  Impression: Technically successful ultrasound guided injection.   Procedure: Real-time Ultrasound Guided Injection of right lateral elbow common extensor tendon origin Device: Philips Affiniti 50G/GE Logiq Images permanently stored and available for review in PACS Ultrasound evaluation prior to injection reveals calcific change at the lateral epicondyle common extensor tendon origin consistent with lateral epicondylitis Verbal informed consent obtained.  Discussed risks and benefits of procedure. Warned about infection, bleeding, hyperglycemia damage to structures among others. Patient expresses understanding and agreement Time-out conducted.   Noted no overlying erythema, induration, or other signs of local infection.   Skin prepped in a sterile fashion.   Local anesthesia: Topical  Ethyl chloride.   With sterile technique and under real time ultrasound guidance: 40 mg of Kenalog and 1 mL of Marcaine  injected into common extensor tendon origin. Fluid seen entering the tendon origin.   Completed without difficulty   Pain immediately resolved suggesting accurate placement of the medication.   Advised to call if fevers/chills, erythema, induration, drainage, or persistent bleeding.   Images permanently stored and available for review in the ultrasound unit.  Impression: Technically successful ultrasound guided injection.        Assessment and Plan: 62 y.o. male with left lateral knee pain.  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  This is an occurrence of a chronic problem previously seen in October 2023.  Plan for repeat steroid injection today.  Right lateral elbow pain thought to be due to lateral epicondylitis or perhaps triceps tendinitis.  He had a similar problem 8 or 9 years ago and did well with injections.  He like to proceed with injection again today.  He had a pretty good pain response following injection indicates that his main problem is probably the calcific change seen at the lateral epicondyle.   PDMP not reviewed this encounter. Orders Placed This Encounter  Procedures   [place] LIMITED JOINT SPACE STRUCTURES LOW LEFT(NO LINKED CHARGES)    Order Specific Question:   Reason for Exam (SYMPTOM  OR DIAGNOSIS REQUIRED)    Answer:   left knee pain    Order Specific Question:   Preferred imaging location?    Answer:   [facility]   No orders of the defined types were placed in this encounter.    Discussed warning signs or symptoms. Please see discharge instructions. Patient expresses understanding.   The above documentation has been reviewed and is accurate and complete Clementeen Graham, M.D.

## 2023-07-08 NOTE — Patient Instructions (Signed)
Thank you for coming in today.   Call or go to the ER if you develop a large red swollen joint with extreme pain or oozing puss.    Try to get 30 reps 1-3x daily.   Let the elbow go from straight to bent slowly with resistance.   Recheck as needed.   Please use Voltaren gel (Generic Diclofenac Gel) up to 4x daily for pain as needed.  This is available over-the-counter as both the name brand Voltaren gel and the generic diclofenac gel.

## 2023-10-27 IMAGING — CR DG ABDOMEN 1V
2 series · 2 of 2 positions shown · non-contrast
Comparison: None.

CLINICAL DATA: Constipation with bloating for 8 months.

EXAM:
ABDOMEN - 1 VIEW

[t abdomen supine (1 of 2)]
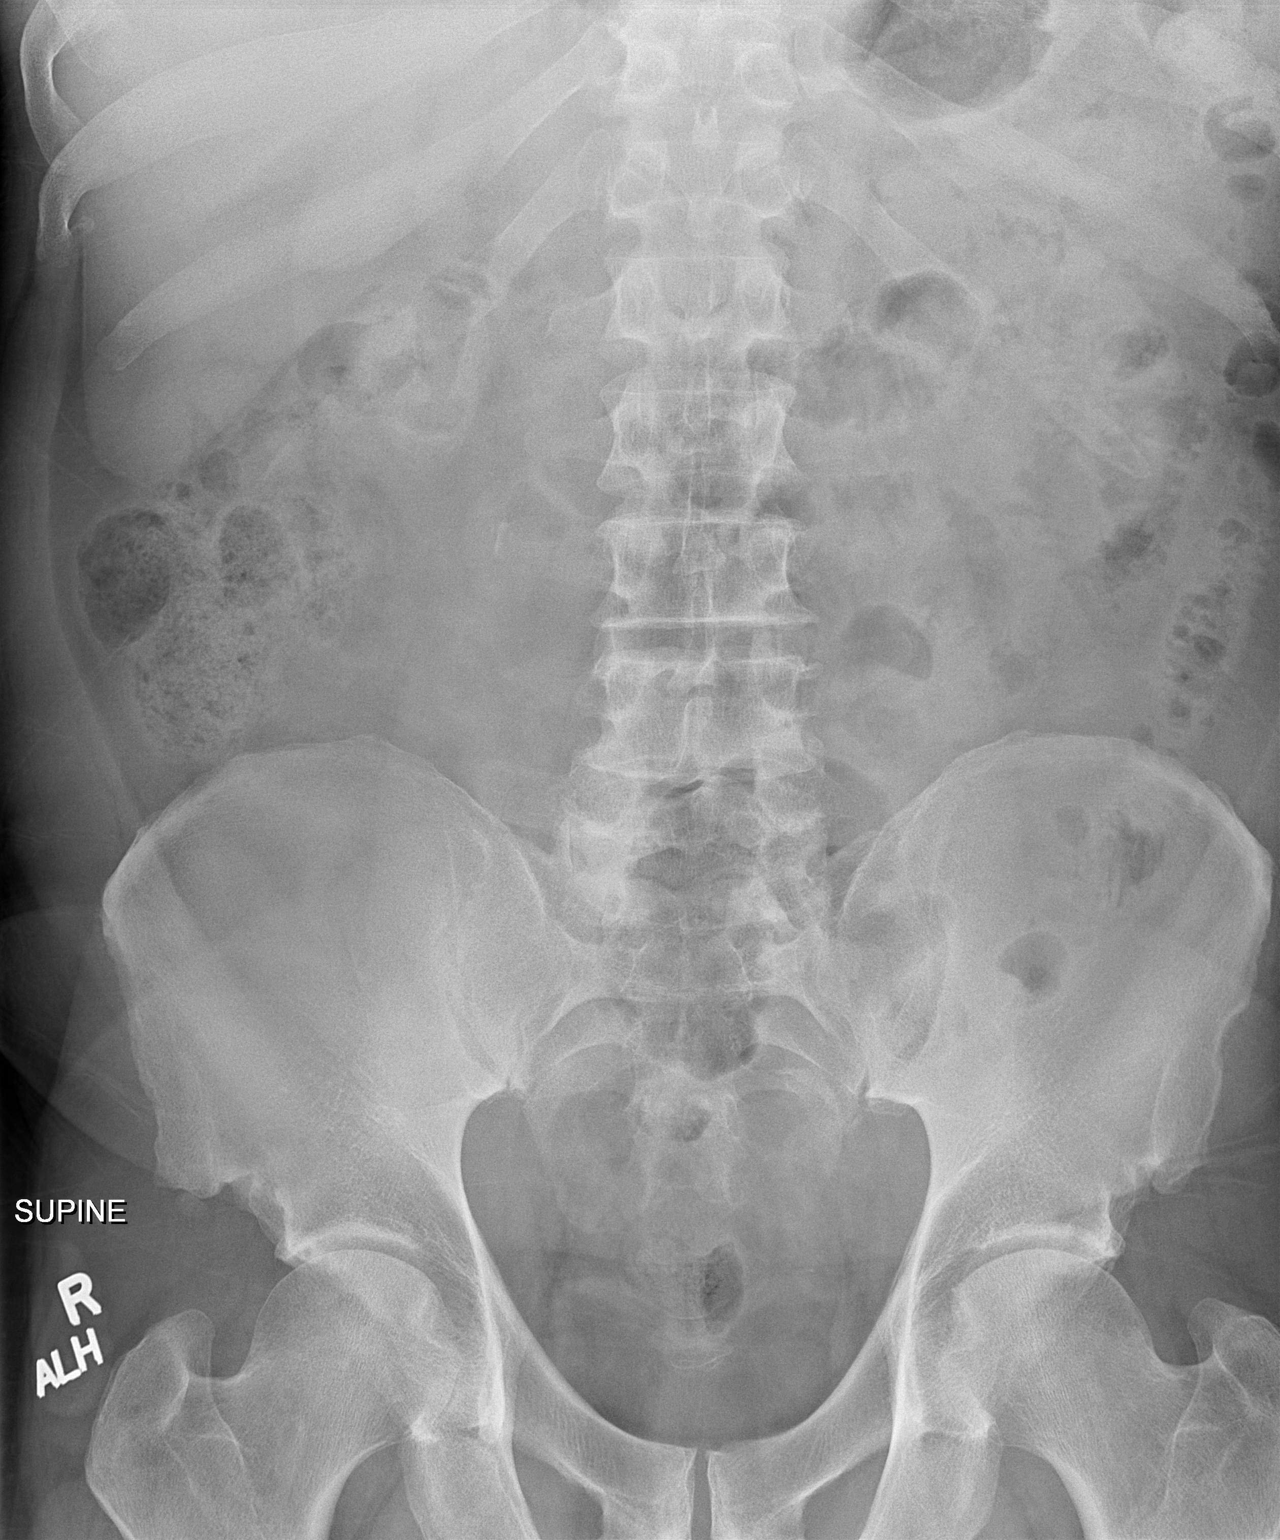

[t abdomen supine (2 of 2)]
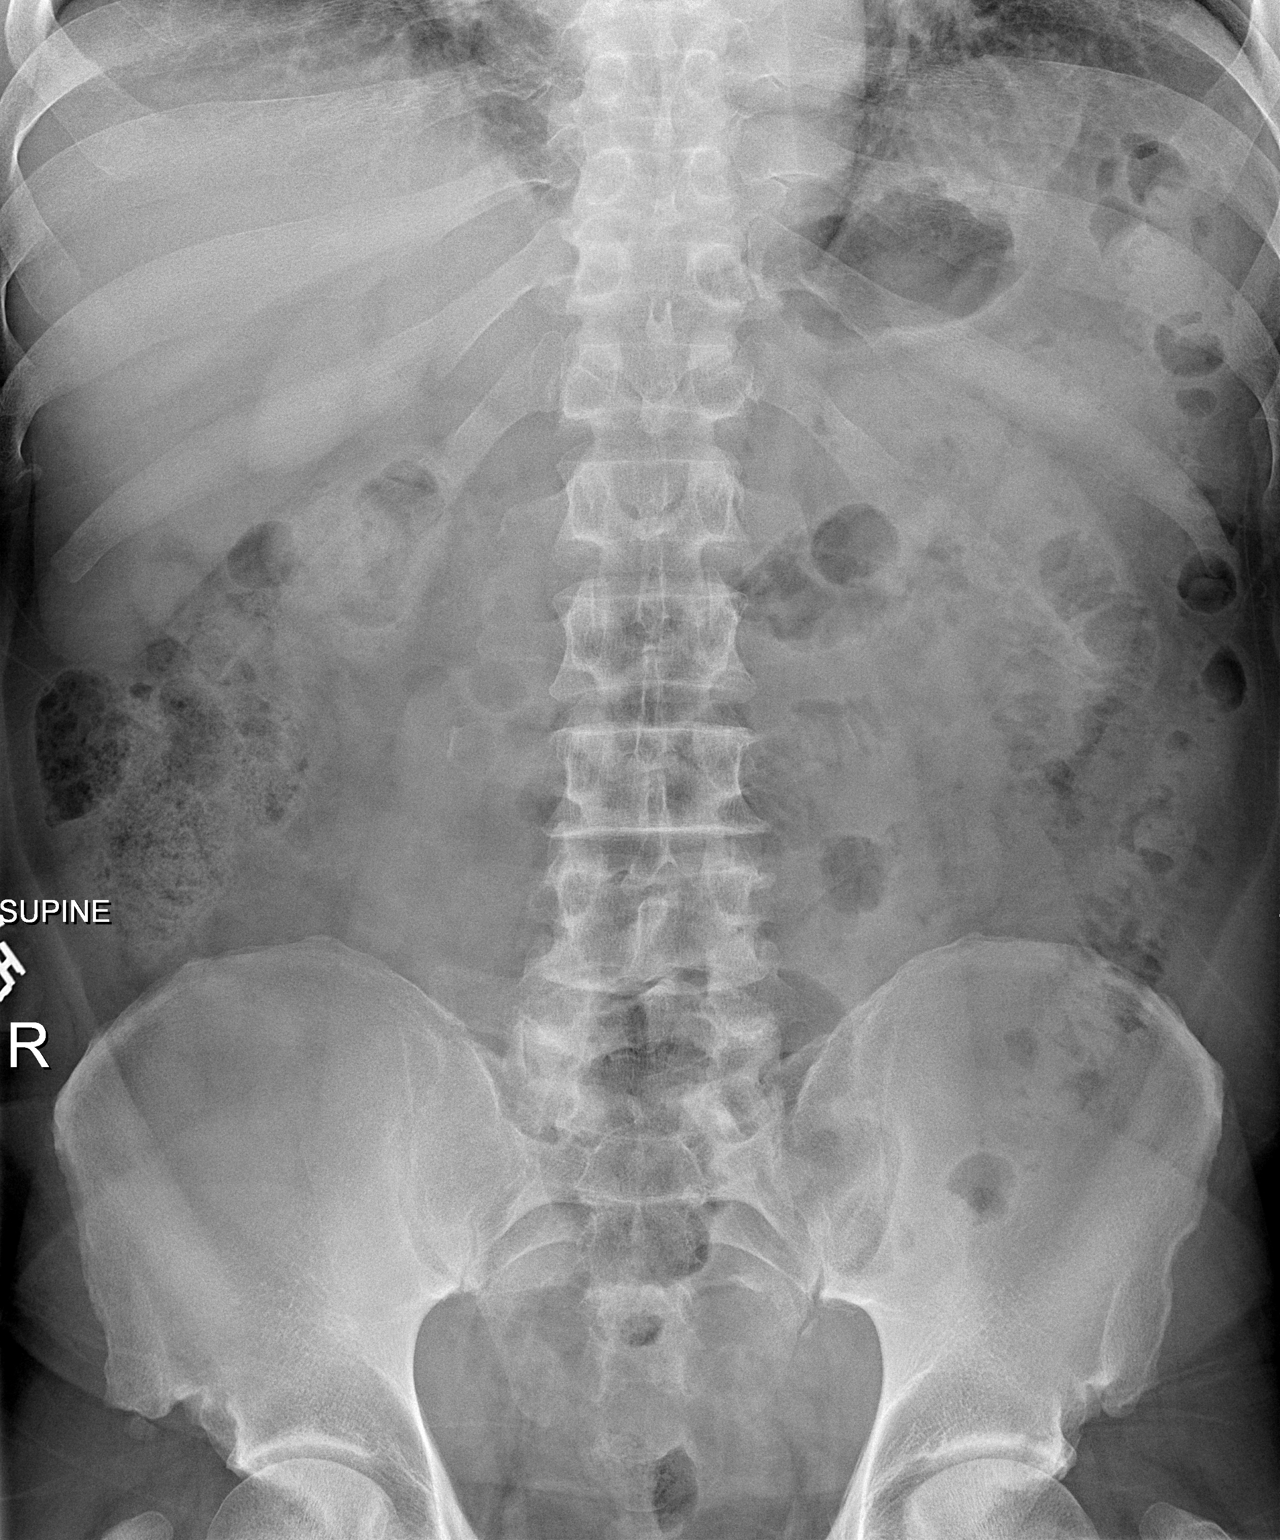

[2 of 2 positions shown; findings below may reference images not displayed]

FINDINGS: Normal bowel gas pattern.  Mild colonic stool burden.

No evidence of renal or ureteral stones. Soft tissues are
unremarkable.

Skeletal structures also unremarkable.
IMPRESSION: Negative.

## 2023-11-20 ENCOUNTER — Encounter: Payer: Self-pay | Admitting: Cardiology

## 2023-11-20 ENCOUNTER — Other Ambulatory Visit: Payer: Self-pay | Admitting: *Deleted

## 2023-11-20 ENCOUNTER — Ambulatory Visit: Payer: Commercial Managed Care - PPO | Attending: Cardiology | Admitting: Cardiology

## 2023-11-20 VITALS — BP 140/82 | HR 72 | Resp 16 | Ht 70.0 in | Wt 208.6 lb

## 2023-11-20 DIAGNOSIS — I10 Essential (primary) hypertension: Secondary | ICD-10-CM

## 2023-11-20 DIAGNOSIS — Z79899 Other long term (current) drug therapy: Secondary | ICD-10-CM

## 2023-11-20 DIAGNOSIS — F17290 Nicotine dependence, other tobacco product, uncomplicated: Secondary | ICD-10-CM

## 2023-11-20 DIAGNOSIS — R0789 Other chest pain: Secondary | ICD-10-CM

## 2023-11-20 DIAGNOSIS — I3139 Other pericardial effusion (noninflammatory): Secondary | ICD-10-CM

## 2023-11-20 MED ORDER — BUPROPION HCL ER (SR) 150 MG PO TB12: 150.0000 mg | ORAL_TABLET | Freq: Two times a day (BID) | ORAL | 2 refills | Status: DC

## 2023-11-20 MED ORDER — LOSARTAN POTASSIUM-HCTZ 100-25 MG PO TABS: 1.0000 | ORAL_TABLET | Freq: Every day | ORAL | 0 refills | Status: AC

## 2023-11-20 NOTE — Patient Instructions (Signed)
Medication Instructions:  Please discontinue your Losartan. Start Losartan/hydrochlorothiazide 100-25 mg once daily. Continue all other medications as listed.  *If you need a refill on your cardiac medications before your next appointment, please call your pharmacy*   Lab Work: Please have blood work in 2 to 3  weeks at your closest [facility].  There is one on the first floor of this building - [address]. If you have labs (blood work) drawn today and your tests are completely normal, you will receive your results only by: MyChart Message (if you have MyChart) OR A paper copy in the mail If you have any lab test that is abnormal or we need to change your treatment, we will call you to review the results.   Testing/Procedures: Your physician has requested that you have an echocardiogram. Echocardiography is a painless test that uses sound waves to create images of your heart. It provides your doctor with information about the size and shape of your heart and how well your heart's chambers and valves are working. This procedure takes approximately one hour. There are no restrictions for this procedure. Please do NOT wear cologne, perfume, aftershave, or lotions (deodorant is allowed). Please arrive 15 minutes prior to your appointment time.  Please note: We ask at that you not bring children with you during ultrasound (echo/ vascular) testing. Due to room size and safety concerns, children are not allowed in the ultrasound rooms during exams. Our front office staff cannot provide observation of children in our lobby area while testing is being conducted. An adult accompanying a patient to their appointment will only be allowed in the ultrasound room at the discretion of the ultrasound technician under special circumstances. We apologize for any inconvenience.   Follow-Up: At [facility], you and your health needs are our priority.  As part of our continuing mission to provide you with  exceptional heart care, we have created designated Provider Care Teams.  These Care Teams include your primary Cardiologist (physician) and Advanced Practice Providers (APPs -  Physician Assistants and Nurse Practitioners) who all work together to provide you with the care you need, when you need it.  We recommend signing up for the patient portal called "MyChart".  Sign up information is provided on this After Visit Summary.  MyChart is used to connect with patients for Virtual Visits (Telemedicine).  Patients are able to view lab/test results, encounter notes, upcoming appointments, etc.  Non-urgent messages can be sent to your provider as well.   To learn more about what you can do with MyChart, go to [email or web address].    Your next appointment:   Follow up as needed with Dr Jacinto Halim

## 2023-11-20 NOTE — Progress Notes (Signed)
Cardiology Office Note:  .   Date:  11/20/2023  ID:  Roy Bryant, DOB Aug 18, 1961, MRN [id number] PCP: Merri Brunette, MD  [facility] Providers Cardiologist:  None   History of Present Illness: .   Roy Bryant is a 62 y.o.   Discussed the use of AI scribe software for clinical note transcription with the patient, who gave verbal consent to proceed.  History of Present Illness   The patient, with a history of hypertension and smoking, presents for evaluation of pericardial effusion detected on a coronary calcium score test. The patient reports a recent onset of chest discomfort, particularly with deep breaths. The discomfort is located in the chest area and has been present for about a week and a half. The patient rates the pain level as between two and three. The patient also reports a history of a similar chest discomfort episode about three years ago, which was severe enough to warrant an emergency service call. The cause of that episode was not determined.  The patient is a current vaper and previously smoked cigarettes. The patient quit smoking cigarettes about five years ago, having smoked about a pack per week. The patient vapes daily and has tried Chantix to quit vaping, but found it less effective than when used to quit smoking cigarettes. The patient is seeking assistance with smoking cessation.  The patient also has a history of asthma and is currently experiencing wheezing. The patient reports that the wheezing has been present for some time.      Review of Systems  Cardiovascular:  Positive for chest pain (occasional sharp pain lasts seconds). Negative for dyspnea on exertion and leg swelling.    Labs   External Labs:  Labs 02/27/2023:  Total cholesterol 162, triglycerides 105, HDL 71, LDL 70.  BUN 10, creatinine 1.32, EGFR 67 mL, LFTs normal.  Physical Exam:   VS:  BP (!) 140/82 (BP Location: Left Arm, Patient Position: Sitting, Cuff Size: Large)    Pulse 72   Resp 16   Ht 5\' 10"  (1.778 m)   Wt 208 lb 9.6 oz (94.6 kg)   SpO2 98%   BMI 29.93 kg/m    Wt Readings from Last 3 Encounters:  11/20/23 208 lb 9.6 oz (94.6 kg)  07/08/23 199 lb (90.3 kg)  09/14/22 195 lb (88.5 kg)     Physical Exam Neck:     Vascular: No carotid bruit or JVD.  Cardiovascular:     Rate and Rhythm: Normal rate and regular rhythm.     Pulses: Intact distal pulses.     Heart sounds: Normal heart sounds. No murmur heard.    No gallop.  Pulmonary:     Effort: Pulmonary effort is normal.     Breath sounds: Normal breath sounds.  Abdominal:     General: Bowel sounds are normal.     Palpations: Abdomen is soft.  Musculoskeletal:     Right lower leg: No edema.     Left lower leg: No edema.     Studies Reviewed: .    CT CARDIAC SCORING (SELF PAY ONLY) 05/29/2023  Coronary calcium score of 0.  Vascular: No aortic atherosclerosis. The included aorta is upper normal in caliber. Anterior pericardial fluid measures up to 15 mm, may be minimally complex. Small pericardial calcification.  Aorta: Borderline dilated to 39 mm, at the main PA bifurcation, measured double-oblique (non-contrast).  EKG:    EKG Interpretation Date/Time:  Wednesday November 20 2023 10:54:44 EST Ventricular Rate:  72 PR Interval:  150 QRS Duration:  86 QT Interval:  364 QTC Calculation: 398 R Axis:   -6  Text Interpretation: EKG 11/20/2023: Normal sinus rhythm at rate of 72 bpm, normal EKG.  No significant change from 07/22/2015. Confirmed by Delrae Rend [phone]) on 11/20/2023 11:02:46 AM    Medications and allergies    No Known Allergies   Current Outpatient Medications:    amLODipine (NORVASC) 5 MG tablet, Take 5 mg by mouth daily., Disp: , Rfl:    buPROPion (WELLBUTRIN SR) 150 MG 12 hr tablet, Take 1 tablet (150 mg total) by mouth 2 (two) times daily., Disp: 60 tablet, Rfl: 2   losartan-hydrochlorothiazide (HYZAAR) 100-25 MG tablet, Take 1 tablet by mouth daily.,  Disp: 90 tablet, Rfl: 0   pantoprazole (PROTONIX) 40 MG tablet, Take 40 mg by mouth daily., Disp: , Rfl:    ASSESSMENT AND PLAN: .      ICD-10-CM   1. Pericardial effusion  I31.39 EKG 12-Lead    ECHOCARDIOGRAM COMPLETE    2. Primary hypertension  I10 amLODipine (NORVASC) 5 MG tablet    losartan-hydrochlorothiazide (HYZAAR) 100-25 MG tablet    Basic metabolic panel    3. Chest pain, musculoskeletal  R07.89     4. Vaping nicotine dependence, tobacco product  F17.290 buPROPion (WELLBUTRIN SR) 150 MG 12 hr tablet    5. Medication management  Z79.899 Basic metabolic panel     Assessment and Plan    Pericardial Effusion Incidental finding on coronary calcium score. No symptoms suggestive of cardiac tamponade. Fluid is likely a normal variant. -Order echocardiogram to evaluate the pericardial effusion.  There is also mild pericardial calcification noted, diastolic function would be appropriate to evaluate also.  Chest Pain Described as a 2-3/10 discomfort in the left chest with deep inspiration. Likely musculoskeletal in origin given the reproducibility with palpation and lack of correlation with exertion. -Advise over-the-counter anti-inflammatory agents such as Voltaren gel or Aleve as needed for pain.  Hypertension Blood pressure elevated at today's visit. Patient reports it is usually slightly elevated.  -Discontinue plain losartan and switch to losartan HCT 100/25 mg in the morning -Start Amlodipine 5mg  daily. -Order BMP in 2-3 weeks to check kidney function and potassium levels. -Follow-up with primary care provider in 4-6 weeks to monitor blood pressure.  Tobacco Use Current daily vaper, previous cigarette smoker. Experiencing wheezing and difficulty quitting vaping. -Start Wellbutrin for smoking cessation. One tablet daily in the morning for three days, then one tablet in the morning and one in the late afternoon. -Encourage use of nicotine replacement therapy (patch or gum)  in conjunction with Wellbutrin, but caution against long-term use.  Sleep Apnea Patient reports difficulty using CPAP machine. Currently using a wedge pillow which has helped with snoring. -No changes to current management. Encourage continued use of wedge pillow and other non-CPAP interventions.  Follow-up Return to cardiology if echocardiogram shows abnormal findings. Otherwise, follow-up with primary care provider for blood pressure management and smoking cessation.  No indication for stress testing as patient already exercises heavily without any chest pain or dyspnea and EKG is essentially normal.  Lipids are normal as well.   Signed,  Yates Decamp, MD, [facility] 11/20/2023, 5:48 PM [facility] [address] [place], [place] [address] Phone: [phone]. Fax:  [phone]

## 2023-12-13 ENCOUNTER — Other Ambulatory Visit: Payer: Self-pay | Admitting: Cardiology

## 2023-12-13 DIAGNOSIS — F17290 Nicotine dependence, other tobacco product, uncomplicated: Secondary | ICD-10-CM

## 2023-12-30 ENCOUNTER — Other Ambulatory Visit (HOSPITAL_COMMUNITY): Payer: Commercial Managed Care - PPO

## 2024-01-06 ENCOUNTER — Encounter (HOSPITAL_COMMUNITY): Payer: Self-pay | Admitting: Cardiology
